# Patient Record
Sex: Female | Born: 1985 | Race: White | Hispanic: No | Marital: Married | Smoking: Never smoker
Health system: Southern US, Community
[De-identification: ages and names within clinical notes are randomized; demographics above are authoritative.]

## PROBLEM LIST (undated history)

## (undated) DIAGNOSIS — F32A Depression, unspecified: Secondary | ICD-10-CM

## (undated) DIAGNOSIS — N809 Endometriosis, unspecified: Secondary | ICD-10-CM

## (undated) DIAGNOSIS — D6861 Antiphospholipid syndrome: Secondary | ICD-10-CM

## (undated) DIAGNOSIS — O26619 Liver and biliary tract disorders in pregnancy, unspecified trimester: Secondary | ICD-10-CM

## (undated) DIAGNOSIS — J45909 Unspecified asthma, uncomplicated: Secondary | ICD-10-CM

## (undated) DIAGNOSIS — K831 Obstruction of bile duct: Secondary | ICD-10-CM

## (undated) DIAGNOSIS — R87629 Unspecified abnormal cytological findings in specimens from vagina: Secondary | ICD-10-CM

## (undated) DIAGNOSIS — A630 Anogenital (venereal) warts: Secondary | ICD-10-CM

## (undated) DIAGNOSIS — K219 Gastro-esophageal reflux disease without esophagitis: Secondary | ICD-10-CM

## (undated) DIAGNOSIS — F419 Anxiety disorder, unspecified: Secondary | ICD-10-CM

## (undated) HISTORY — PX: WISDOM TOOTH EXTRACTION: SHX21

## (undated) HISTORY — PX: COLPOSCOPY: SHX161

---

## 2016-01-24 LAB — OB RESULTS CONSOLE GC/CHLAMYDIA
CHLAMYDIA, DNA PROBE: NEGATIVE
Gonorrhea: NEGATIVE

## 2016-02-03 LAB — OB RESULTS CONSOLE HEPATITIS B SURFACE ANTIGEN: HEP B S AG: NEGATIVE

## 2016-02-03 LAB — OB RESULTS CONSOLE ANTIBODY SCREEN: Antibody Screen: NEGATIVE

## 2016-02-03 LAB — OB RESULTS CONSOLE RPR: RPR: NONREACTIVE

## 2016-02-03 LAB — OB RESULTS CONSOLE HIV ANTIBODY (ROUTINE TESTING): HIV: NONREACTIVE

## 2016-02-03 LAB — OB RESULTS CONSOLE RUBELLA ANTIBODY, IGM: Rubella: IMMUNE

## 2016-02-03 LAB — OB RESULTS CONSOLE ABO/RH: RH TYPE: POSITIVE

## 2016-03-06 ENCOUNTER — Inpatient Hospital Stay (HOSPITAL_COMMUNITY): Admission: AD | Admit: 2016-03-06 | Payer: Self-pay | Source: Ambulatory Visit | Admitting: Obstetrics and Gynecology

## 2016-07-25 LAB — OB RESULTS CONSOLE GBS: STREP GROUP B AG: NEGATIVE

## 2016-08-26 ENCOUNTER — Inpatient Hospital Stay (HOSPITAL_COMMUNITY): Payer: Managed Care, Other (non HMO) | Admitting: Anesthesiology

## 2016-08-26 ENCOUNTER — Inpatient Hospital Stay (HOSPITAL_COMMUNITY)
Admission: AD | Admit: 2016-08-26 | Discharge: 2016-08-29 | DRG: 766 | Disposition: A | Discharge status: Home / Self Care | Payer: Managed Care, Other (non HMO) | Source: Ambulatory Visit | Attending: Obstetrics | Admitting: Obstetrics

## 2016-08-26 ENCOUNTER — Encounter (HOSPITAL_COMMUNITY): Payer: Self-pay | Admitting: *Deleted

## 2016-08-26 DIAGNOSIS — O9902 Anemia complicating childbirth: Secondary | ICD-10-CM | POA: Diagnosis present

## 2016-08-26 DIAGNOSIS — O9962 Diseases of the digestive system complicating childbirth: Secondary | ICD-10-CM | POA: Diagnosis present

## 2016-08-26 DIAGNOSIS — O324XX0 Maternal care for high head at term, not applicable or unspecified: Secondary | ICD-10-CM | POA: Diagnosis present

## 2016-08-26 DIAGNOSIS — Z98891 History of uterine scar from previous surgery: Secondary | ICD-10-CM

## 2016-08-26 DIAGNOSIS — O1405 Mild to moderate pre-eclampsia, complicating the puerperium: Secondary | ICD-10-CM | POA: Diagnosis not present

## 2016-08-26 DIAGNOSIS — Z3A40 40 weeks gestation of pregnancy: Secondary | ICD-10-CM

## 2016-08-26 DIAGNOSIS — Z3493 Encounter for supervision of normal pregnancy, unspecified, third trimester: Secondary | ICD-10-CM | POA: Diagnosis present

## 2016-08-26 DIAGNOSIS — O3663X0 Maternal care for excessive fetal growth, third trimester, not applicable or unspecified: Principal | ICD-10-CM | POA: Diagnosis present

## 2016-08-26 DIAGNOSIS — D649 Anemia, unspecified: Secondary | ICD-10-CM | POA: Diagnosis present

## 2016-08-26 DIAGNOSIS — K219 Gastro-esophageal reflux disease without esophagitis: Secondary | ICD-10-CM | POA: Diagnosis present

## 2016-08-26 HISTORY — DX: Endometriosis, unspecified: N80.9

## 2016-08-26 HISTORY — DX: Unspecified abnormal cytological findings in specimens from vagina: R87.629

## 2016-08-26 HISTORY — DX: Gastro-esophageal reflux disease without esophagitis: K21.9

## 2016-08-26 HISTORY — DX: Anogenital (venereal) warts: A63.0

## 2016-08-26 LAB — TYPE AND SCREEN
ABO/RH(D): A POS
Antibody Screen: NEGATIVE

## 2016-08-26 LAB — CBC
HEMATOCRIT: 39.3 % (ref 36.0–46.0)
HEMOGLOBIN: 13.5 g/dL (ref 12.0–15.0)
MCH: 33.1 pg (ref 26.0–34.0)
MCHC: 34.4 g/dL (ref 30.0–36.0)
MCV: 96.3 fL (ref 78.0–100.0)
Platelets: 168 10*3/uL (ref 150–400)
RBC: 4.08 MIL/uL (ref 3.87–5.11)
RDW: 13.1 % (ref 11.5–15.5)
WBC: 7.8 10*3/uL (ref 4.0–10.5)

## 2016-08-26 LAB — ABO/RH: ABO/RH(D): A POS

## 2016-08-26 MED ORDER — LACTATED RINGERS IV SOLN
500.0000 mL | Freq: Once | INTRAVENOUS | Status: AC
  Administered 2016-08-26: 500 mL via INTRAVENOUS

## 2016-08-26 MED ORDER — DIPHENHYDRAMINE HCL 50 MG/ML IJ SOLN: 12.5000 mg | INTRAMUSCULAR | Status: DC | PRN

## 2016-08-26 MED ORDER — LIDOCAINE HCL (PF) 1 % IJ SOLN
30.0000 mL | INTRAMUSCULAR | Status: DC | PRN
  Filled 2016-08-26: qty 30

## 2016-08-26 MED ORDER — LACTATED RINGERS IV SOLN
INTRAVENOUS | Status: DC
  Administered 2016-08-26 (×2): 125 mL/h via INTRAVENOUS
  Administered 2016-08-27: 01:00:00 via INTRAVENOUS

## 2016-08-26 MED ORDER — LACTATED RINGERS IV SOLN: 500.0000 mL | INTRAVENOUS | Status: DC | PRN

## 2016-08-26 MED ORDER — ACETAMINOPHEN 325 MG PO TABS: 650.0000 mg | ORAL_TABLET | ORAL | Status: DC | PRN

## 2016-08-26 MED ORDER — FENTANYL 2.5 MCG/ML BUPIVACAINE 1/10 % EPIDURAL INFUSION (WH - ANES)
14.0000 mL/h | INTRAMUSCULAR | Status: DC | PRN
  Administered 2016-08-26: 14 mL/h via EPIDURAL
  Filled 2016-08-26 (×2): qty 100

## 2016-08-26 MED ORDER — OXYTOCIN 40 UNITS IN LACTATED RINGERS INFUSION - SIMPLE MED
2.5000 [IU]/h | INTRAVENOUS | Status: DC
  Filled 2016-08-26: qty 1000

## 2016-08-26 MED ORDER — TERBUTALINE SULFATE 1 MG/ML IJ SOLN: 0.2500 mg | Freq: Once | INTRAMUSCULAR | Status: DC | PRN

## 2016-08-26 MED ORDER — PHENYLEPHRINE 40 MCG/ML (10ML) SYRINGE FOR IV PUSH (FOR BLOOD PRESSURE SUPPORT)
80.0000 ug | PREFILLED_SYRINGE | INTRAVENOUS | Status: DC | PRN
  Filled 2016-08-26: qty 10

## 2016-08-26 MED ORDER — OXYCODONE-ACETAMINOPHEN 5-325 MG PO TABS: 1.0000 | ORAL_TABLET | ORAL | Status: DC | PRN

## 2016-08-26 MED ORDER — OXYTOCIN BOLUS FROM INFUSION: 500.0000 mL | Freq: Once | INTRAVENOUS | Status: DC

## 2016-08-26 MED ORDER — ONDANSETRON HCL 4 MG/2ML IJ SOLN
4.0000 mg | Freq: Four times a day (QID) | INTRAMUSCULAR | Status: DC | PRN
  Administered 2016-08-26: 4 mg via INTRAVENOUS
  Filled 2016-08-26: qty 2

## 2016-08-26 MED ORDER — SOD CITRATE-CITRIC ACID 500-334 MG/5ML PO SOLN
30.0000 mL | ORAL | Status: DC | PRN
  Administered 2016-08-26 – 2016-08-27 (×2): 30 mL via ORAL
  Filled 2016-08-26 (×2): qty 15

## 2016-08-26 MED ORDER — EPHEDRINE 5 MG/ML INJ: 10.0000 mg | INTRAVENOUS | Status: DC | PRN

## 2016-08-26 MED ORDER — OXYTOCIN 40 UNITS IN LACTATED RINGERS INFUSION - SIMPLE MED
1.0000 m[IU]/min | INTRAVENOUS | Status: DC
  Administered 2016-08-26: 2 m[IU]/min via INTRAVENOUS

## 2016-08-26 MED ORDER — PHENYLEPHRINE 40 MCG/ML (10ML) SYRINGE FOR IV PUSH (FOR BLOOD PRESSURE SUPPORT): 80.0000 ug | PREFILLED_SYRINGE | INTRAVENOUS | Status: DC | PRN

## 2016-08-26 MED ORDER — OXYCODONE-ACETAMINOPHEN 5-325 MG PO TABS: 2.0000 | ORAL_TABLET | ORAL | Status: DC | PRN

## 2016-08-26 MED ORDER — LIDOCAINE HCL (PF) 1 % IJ SOLN
INTRAMUSCULAR | Status: DC | PRN
  Administered 2016-08-26 (×2): 4 mL via EPIDURAL

## 2016-08-26 MED ORDER — FLEET ENEMA 7-19 GM/118ML RE ENEM: 1.0000 | ENEMA | Freq: Once | RECTAL | Status: DC

## 2016-08-26 NOTE — Anesthesia Pain Management Evaluation Note (Signed)
  CRNA Pain Management Visit Note  Patient: Kaitlin Ward, 31 y.o., female  "Hello I am a member of the anesthesia team at [facility]. We have an anesthesia team available at all times to provide care throughout the hospital, including epidural management and anesthesia for C-section. I don't know your plan for the delivery whether it a natural birth, water birth, IV sedation, nitrous supplementation, doula or epidural, but we want to meet your pain goals."   1.Was your pain managed to your expectations on prior hospitalizations?   No prior hospitalizations  2.What is your expectation for pain management during this hospitalization?     Epidural  3.How can we help you reach that goal? Maintain the epidural until baby is delivered.  Record the patient's initial score and the patient's pain goal.   Pain: 10 prior to epidural, it is now rated at 1.  Pain Goal: 2 [facility] wants you to be able to say your pain was always managed very well.  Seth Friedlander 08/26/2016

## 2016-08-26 NOTE — Progress Notes (Signed)
S: Doing well, no complaints, pain well controlled with epidural  O: BP (!) 144/85   Pulse 92   Temp 98.5 F (36.9 C) (Oral)   Resp 18   Ht 5\' 5"  (1.651 m)   Wt 69.9 kg (154 lb)   BMI 25.63 kg/m    FHT:  FHR: 130s bpm, variability: minimal ,  accelerations:  Present,  decelerations:  Absent UC:   regular, every 2-4 minutes SVE:   Dilation: 8 Effacement (%): 80 Station: -2 Exam by:: Dr. Ernestina PennaFogleman   A / P:  31 y.o.  Obstetric History   G1   P0   T0   P0   A0   L0    SAB0   TAB0   Ectopic0   Multiple0   Live Births0    at 3460w5d Active labor,no change over several hours, IUPC placed  Fetal Wellbeing:  Category I Pain Control:  Epidural  Anticipated MOD:  unsure  Cathy Ropp A. 08/26/2016, 9:06 PM

## 2016-08-26 NOTE — H&P (Signed)
Kaitlin LauthKathleen Ward is a 31 y.o. G1P0 at 73102w5d presenting for active . Pt notes onset contractions at 11 AM but really got more painful and frequent at 1:30 PM. . Good fetal movement, No vaginal bleeding, started leaking fluid at 11 AM.  [facility] - Dated by first trimester ultrasound - Elevated diabetic screen at 138% normal 3 hour gtt. - GERD on Zantac - LSIL Pap, high risk HPV positive, 16/18 negative - LGA baby, growth at 34 weeks at 90th percentile with AC at 98th percentile; growth at 40'2: 8'15, 87th percentile   Prenatal Transfer Tool  Maternal Diabetes: No Genetic Screening: Normal Maternal Ultrasounds/Referrals: Normal Fetal Ultrasounds or other Referrals:  None Maternal Substance Abuse:  No Significant Maternal Medications:  None Significant Maternal Lab Results: None     OB History    Gravida Para Term Preterm AB Living   1             SAB TAB Ectopic Multiple Live Births                 Past Medical History:  Diagnosis Date  . Endometriosis   . GERD (gastroesophageal reflux disease)    Past Surgical History:  Procedure Laterality Date  . COLPOSCOPY     Family History: family history is not on file. Social History:  has no tobacco, alcohol, and drug history on file.  Review of Systems - Negative except Leaking fluid, painful contractions   Dilation: 6 Effacement (%): 100 Station: -1 Exam by:: Weston,RN Blood pressure (!) 152/91, pulse 86, temperature 98.7 F (37.1 C), temperature source Oral, resp. rate 20, height 5\' 5"  (1.651 m), weight 69.9 kg (154 lb).  Physical Exam:  Gen: well appearing, no distress, breathing uncomfortably with contractions  Back: no CVAT Abd: gravid, NT, no RUQ pain, LGA LE: No edema, equal bilaterally, non-tender Toco: Every 3 minutes FH: baseline 125s, accelerations present, no deceleratons, 10 beat variability  Prenatal labs: ABO, Rh: A/Positive/-- (12/30 0000) Antibody: Negative (12/30  0000) Rubella: !Error! Immune RPR: Nonreactive (12/30 0000)  HBsAg: Negative (12/30 0000)  HIV: Non-reactive (12/30 0000)  GBS: Negative (06/21 0000)  1 hr Glucola 138  Genetic screening normal NT, normal AFP Anatomy US normal anatomy   Assessment/Plan: 31 y.o. G1P0 at 33102w5d Active labor. Plan expectant management. Patient hoping for unmedicated delivery and has doula with her.  GBS negative LGA: Watch labor curve   Gerlean Cid A. 08/26/2016, 3:57 PM

## 2016-08-26 NOTE — Progress Notes (Signed)
S: Doing well, no complaints, pain well controlled with epidural.  O: BP 136/87   Pulse 94   Temp 99.5 F (37.5 C) (Oral)   Resp 18   Ht 5\' 5"  (1.651 m)   Wt 69.9 kg (154 lb)   BMI 25.63 kg/m    FHT:  FHR: 135s bpm, variability: minimal ,  accelerations:  Present,  decelerations:  Absent UC:   regular, every 2-4 minutes, low MVU SVE:   Dilation: 9 Effacement (%): 80 Station: 0 Exam by:: Minus Libertyhristy Leshowitz, RN   A / P:  31 y.o.  Obstetric History   G1   P0   T0   P0   A0   L0    SAB0   TAB0   Ectopic0   Multiple0   Live Births0    at 322w5d slow progress, concern for developing chorio given decreased FH variability, low MVU, and pt's temp. Encouraged pt to consider pitocin for augmentation  Fetal Wellbeing:  Category I Pain Control:  Epidural  Anticipated MOD:  Unsure  Kaitlin Ward A. 08/26/2016, 10:13 PM

## 2016-08-26 NOTE — Anesthesia Preprocedure Evaluation (Addendum)
Anesthesia Evaluation  Patient identified by MRN, date of birth, ID band Patient awake    Reviewed: Allergy & Precautions, Patient's Chart, lab work & pertinent test results  Airway Mallampati: II  TM Distance: >3 FB Neck ROM: Full    Dental no notable dental hx. (+) Teeth Intact   Pulmonary neg pulmonary ROS,    Pulmonary exam normal breath sounds clear to auscultation       Cardiovascular negative cardio ROS Normal cardiovascular exam Rhythm:Regular Rate:Normal     Neuro/Psych negative neurological ROS  negative psych ROS   GI/Hepatic Neg liver ROS, GERD  Medicated and Controlled,  Endo/Other  negative endocrine ROS  Renal/GU negative Renal ROS  negative genitourinary   Musculoskeletal negative musculoskeletal ROS (+)   Abdominal   Peds  Hematology negative hematology ROS (+)   Anesthesia Other Findings   Reproductive/Obstetrics (+) Pregnancy HPV                             Anesthesia Physical Anesthesia Plan  ASA: II and emergent  Anesthesia Plan: Epidural   Post-op Pain Management:    Induction:   PONV Risk Score and Plan: 2 and Ondansetron, Dexamethasone, Scopolamine patch - Pre-op, Metaclopromide and Treatment may vary due to age or medical condition  Airway Management Planned: Natural Airway  Additional Equipment:   Intra-op Plan:   Post-operative Plan:   Informed Consent: I have reviewed the patients History and Physical, chart, labs and discussed the procedure including the risks, benefits and alternatives for the proposed anesthesia with the patient or authorized representative who has indicated his/her understanding and acceptance.     Plan Discussed with: Anesthesiologist, Surgeon and CRNA  Anesthesia Plan Comments: (Patient for C/section for arrest of descent. Will use epidural for C/section.)       Anesthesia Quick Evaluation

## 2016-08-26 NOTE — Anesthesia Procedure Notes (Signed)
Epidural Patient location during procedure: OB Start time: 08/26/2016 5:31 PM  Staffing Anesthesiologist: Mal AmabileFOSTER, Luciel Brickman Performed: anesthesiologist   Preanesthetic Checklist Completed: patient identified, site marked, surgical consent, pre-op evaluation, timeout performed, IV checked, risks and benefits discussed and monitors and equipment checked  Epidural Patient position: sitting Prep: site prepped and draped and DuraPrep Patient monitoring: continuous pulse ox and blood pressure Approach: midline Location: L3-L4 Injection technique: LOR air  Needle:  Needle type: Tuohy  Needle gauge: 17 G Needle length: 9 cm and 9 Needle insertion depth: 4 cm Catheter type: closed end flexible Catheter size: 19 Gauge Catheter at skin depth: 9 cm Test dose: negative and Other  Assessment Events: blood not aspirated, injection not painful, no injection resistance, negative IV test and no paresthesia  Additional Notes Patient identified. Risks and benefits discussed including failed block, incomplete  Pain control, post dural puncture headache, nerve damage, paralysis, blood pressure Changes, nausea, vomiting, reactions to medications-both toxic and allergic and post Partum back pain. All questions were answered. Patient expressed understanding and wished to proceed. Sterile technique was used throughout procedure. Epidural site was Dressed with sterile barrier dressing. No paresthesias, signs of intravascular injection Or signs of intrathecal spread were encountered.  Patient was more comfortable after the epidural was dosed. Please see RN's note for documentation of vital signs and FHR which are stable.

## 2016-08-26 NOTE — Progress Notes (Signed)
Pt initialling leaning over bed then on hand and knees in bed

## 2016-08-26 NOTE — Progress Notes (Signed)
S: Doing well, no complaints, pain well controlled with epidural  O: BP 139/81   Pulse 73   Temp 98.1 F (36.7 C) (Oral)   Resp 20   Ht 5\' 5"  (1.651 m)   Wt 69.9 kg (154 lb)   BMI 25.63 kg/m    FHT:  FHR: 140s bpm, variability: moderate,  accelerations:  Present,  decelerations:  Absent UC:   regular, every 4 minutes SVE:   Dilation: 8 Effacement (%): 80 Station: -2 Exam by:: Dr Ernestina PennaFogleman  AROM forebag, meconium  A / P:  31 y.o.  Obstetric History   G1   P0   T0   P0   A0   L0    SAB0   TAB0   Ectopic0   Multiple0   Live Births0    at 2643w5d Spontaneous labor, progressing normally  Fetal Wellbeing:  Category I Pain Control:  Epidural  Anticipated MOD:  NSVD  Lyndon Chapel A. 08/26/2016, 6:22 PM

## 2016-08-26 NOTE — MAU Note (Signed)
Pt presents with contractions and ROM at 1130

## 2016-08-27 ENCOUNTER — Encounter (HOSPITAL_COMMUNITY)
Admission: AD | Disposition: A | Discharge status: Home / Self Care | Payer: Self-pay | Source: Ambulatory Visit | Attending: Obstetrics

## 2016-08-27 ENCOUNTER — Encounter (HOSPITAL_COMMUNITY): Payer: Self-pay | Admitting: Anesthesiology

## 2016-08-27 DIAGNOSIS — Z98891 History of uterine scar from previous surgery: Secondary | ICD-10-CM

## 2016-08-27 LAB — BASIC METABOLIC PANEL
ANION GAP: 8 (ref 5–15)
BUN: 16 mg/dL (ref 6–20)
CALCIUM: 8.8 mg/dL — AB (ref 8.9–10.3)
CO2: 22 mmol/L (ref 22–32)
Chloride: 106 mmol/L (ref 101–111)
Creatinine, Ser: 1.56 mg/dL — ABNORMAL HIGH (ref 0.44–1.00)
GFR calc Af Amer: 51 mL/min — ABNORMAL LOW (ref >60)
GFR, EST NON AFRICAN AMERICAN: 44 mL/min — AB (ref >60)
GLUCOSE: 111 mg/dL — AB (ref 65–99)
Potassium: 4.2 mmol/L (ref 3.5–5.1)
SODIUM: 136 mmol/L (ref 135–145)

## 2016-08-27 LAB — CBC
HCT: 33.4 % — ABNORMAL LOW (ref 36.0–46.0)
HEMOGLOBIN: 11.3 g/dL — AB (ref 12.0–15.0)
MCH: 32.7 pg (ref 26.0–34.0)
MCHC: 33.8 g/dL (ref 30.0–36.0)
MCV: 96.5 fL (ref 78.0–100.0)
Platelets: 127 10*3/uL — ABNORMAL LOW (ref 150–400)
RBC: 3.46 MIL/uL — ABNORMAL LOW (ref 3.87–5.11)
RDW: 13.5 % (ref 11.5–15.5)
WBC: 14.1 10*3/uL — ABNORMAL HIGH (ref 4.0–10.5)

## 2016-08-27 LAB — HEPATIC FUNCTION PANEL
ALK PHOS: 149 U/L — AB (ref 38–126)
ALT: 12 U/L — ABNORMAL LOW (ref 14–54)
AST: 34 U/L (ref 15–41)
Albumin: 2.5 g/dL — ABNORMAL LOW (ref 3.5–5.0)
BILIRUBIN INDIRECT: 0.3 mg/dL (ref 0.3–0.9)
BILIRUBIN TOTAL: 0.4 mg/dL (ref 0.3–1.2)
Bilirubin, Direct: 0.1 mg/dL (ref 0.1–0.5)
Total Protein: 4.9 g/dL — ABNORMAL LOW (ref 6.5–8.1)

## 2016-08-27 LAB — PROTEIN / CREATININE RATIO, URINE
CREATININE, URINE: 100 mg/dL
Protein Creatinine Ratio: 1 mg/mg{Cre} — ABNORMAL HIGH (ref 0.00–0.15)
Total Protein, Urine: 100 mg/dL

## 2016-08-27 LAB — RPR: RPR Ser Ql: NONREACTIVE

## 2016-08-27 LAB — URIC ACID: URIC ACID, SERUM: 9 mg/dL — AB (ref 2.3–6.6)

## 2016-08-27 SURGERY — Surgical Case
Anesthesia: Epidural

## 2016-08-27 MED ORDER — LACTATED RINGERS IV SOLN
INTRAVENOUS | Status: DC
  Administered 2016-08-27: 10:00:00 via INTRAVENOUS

## 2016-08-27 MED ORDER — MEPERIDINE HCL 25 MG/ML IJ SOLN
INTRAMUSCULAR | Status: DC | PRN
  Administered 2016-08-27 (×2): 12.5 mg via INTRAVENOUS

## 2016-08-27 MED ORDER — SODIUM BICARBONATE 8.4 % IV SOLN
INTRAVENOUS | Status: AC
  Filled 2016-08-27: qty 50

## 2016-08-27 MED ORDER — WITCH HAZEL-GLYCERIN EX PADS: 1.0000 "application " | MEDICATED_PAD | CUTANEOUS | Status: DC | PRN

## 2016-08-27 MED ORDER — LABETALOL HCL 5 MG/ML IV SOLN
20.0000 mg | INTRAVENOUS | Status: DC | PRN
  Administered 2016-08-27: 20 mg via INTRAVENOUS

## 2016-08-27 MED ORDER — CEFAZOLIN SODIUM-DEXTROSE 2-3 GM-% IV SOLR
INTRAVENOUS | Status: DC | PRN
  Administered 2016-08-27: 2 g via INTRAVENOUS

## 2016-08-27 MED ORDER — SIMETHICONE 80 MG PO CHEW
80.0000 mg | CHEWABLE_TABLET | ORAL | Status: DC
  Administered 2016-08-28 (×2): 80 mg via ORAL
  Filled 2016-08-27 (×2): qty 1

## 2016-08-27 MED ORDER — ZOLPIDEM TARTRATE 5 MG PO TABS: 5.0000 mg | ORAL_TABLET | Freq: Every evening | ORAL | Status: DC | PRN

## 2016-08-27 MED ORDER — FENTANYL CITRATE (PF) 100 MCG/2ML IJ SOLN
INTRAMUSCULAR | Status: AC
Start: 2016-08-27 — End: 2016-08-27
  Filled 2016-08-27: qty 2

## 2016-08-27 MED ORDER — ACETAMINOPHEN 325 MG PO TABS
650.0000 mg | ORAL_TABLET | ORAL | Status: DC | PRN
  Administered 2016-08-27: 650 mg via ORAL
  Filled 2016-08-27: qty 2

## 2016-08-27 MED ORDER — SENNOSIDES-DOCUSATE SODIUM 8.6-50 MG PO TABS
2.0000 | ORAL_TABLET | ORAL | Status: DC
  Administered 2016-08-28 (×2): 2 via ORAL
  Filled 2016-08-27 (×2): qty 2

## 2016-08-27 MED ORDER — DEXAMETHASONE SODIUM PHOSPHATE 10 MG/ML IJ SOLN
INTRAMUSCULAR | Status: DC | PRN
  Administered 2016-08-27: 10 mg via INTRAVENOUS

## 2016-08-27 MED ORDER — MENTHOL 3 MG MT LOZG
1.0000 | LOZENGE | OROMUCOSAL | Status: DC | PRN
  Administered 2016-08-27: 3 mg via ORAL
  Filled 2016-08-27: qty 9

## 2016-08-27 MED ORDER — SCOPOLAMINE 1 MG/3DAYS TD PT72
MEDICATED_PATCH | TRANSDERMAL | Status: DC | PRN
  Administered 2016-08-27: 1 via TRANSDERMAL

## 2016-08-27 MED ORDER — SODIUM CHLORIDE 0.9 % IR SOLN
Status: DC | PRN
  Administered 2016-08-27: 1

## 2016-08-27 MED ORDER — ONDANSETRON HCL 4 MG/2ML IJ SOLN
INTRAMUSCULAR | Status: DC | PRN
  Administered 2016-08-27: 4 mg via INTRAVENOUS

## 2016-08-27 MED ORDER — LACTATED RINGERS IV SOLN
INTRAVENOUS | Status: DC | PRN
  Administered 2016-08-27: 05:00:00 via INTRAVENOUS

## 2016-08-27 MED ORDER — FENTANYL CITRATE (PF) 100 MCG/2ML IJ SOLN
INTRAMUSCULAR | Status: DC | PRN
  Administered 2016-08-27: 100 ug via EPIDURAL

## 2016-08-27 MED ORDER — COCONUT OIL OIL: 1.0000 "application " | TOPICAL_OIL | Status: DC | PRN

## 2016-08-27 MED ORDER — MORPHINE SULFATE (PF) 0.5 MG/ML IJ SOLN
INTRAMUSCULAR | Status: AC
  Filled 2016-08-27: qty 10

## 2016-08-27 MED ORDER — METOCLOPRAMIDE HCL 5 MG/ML IJ SOLN: 10.0000 mg | Freq: Once | INTRAMUSCULAR | Status: DC | PRN

## 2016-08-27 MED ORDER — ONDANSETRON HCL 4 MG/2ML IJ SOLN
INTRAMUSCULAR | Status: AC
  Filled 2016-08-27: qty 2

## 2016-08-27 MED ORDER — DEXAMETHASONE SODIUM PHOSPHATE 10 MG/ML IJ SOLN
INTRAMUSCULAR | Status: AC
  Filled 2016-08-27: qty 1

## 2016-08-27 MED ORDER — OXYTOCIN 10 UNIT/ML IJ SOLN
INTRAMUSCULAR | Status: AC
  Filled 2016-08-27: qty 4

## 2016-08-27 MED ORDER — FENTANYL CITRATE (PF) 100 MCG/2ML IJ SOLN
INTRAMUSCULAR | Status: AC
  Filled 2016-08-27: qty 2

## 2016-08-27 MED ORDER — LACTATED RINGERS IV SOLN
INTRAVENOUS | Status: DC | PRN
  Administered 2016-08-27 (×2): via INTRAVENOUS

## 2016-08-27 MED ORDER — LABETALOL HCL 5 MG/ML IV SOLN
INTRAVENOUS | Status: AC
  Filled 2016-08-27: qty 4

## 2016-08-27 MED ORDER — LIDOCAINE-EPINEPHRINE (PF) 2 %-1:200000 IJ SOLN
INTRAMUSCULAR | Status: AC
  Filled 2016-08-27: qty 20

## 2016-08-27 MED ORDER — IBUPROFEN 600 MG PO TABS
600.0000 mg | ORAL_TABLET | Freq: Four times a day (QID) | ORAL | Status: DC
  Administered 2016-08-27 – 2016-08-29 (×9): 600 mg via ORAL
  Filled 2016-08-27 (×10): qty 1

## 2016-08-27 MED ORDER — SIMETHICONE 80 MG PO CHEW
80.0000 mg | CHEWABLE_TABLET | Freq: Three times a day (TID) | ORAL | Status: DC
  Administered 2016-08-27 – 2016-08-28 (×4): 80 mg via ORAL
  Filled 2016-08-27 (×6): qty 1

## 2016-08-27 MED ORDER — DIPHENHYDRAMINE HCL 25 MG PO CAPS: 25.0000 mg | ORAL_CAPSULE | Freq: Four times a day (QID) | ORAL | Status: DC | PRN

## 2016-08-27 MED ORDER — OXYTOCIN 40 UNITS IN LACTATED RINGERS INFUSION - SIMPLE MED: 2.5000 [IU]/h | INTRAVENOUS | Status: AC

## 2016-08-27 MED ORDER — TETANUS-DIPHTH-ACELL PERTUSSIS 5-2.5-18.5 LF-MCG/0.5 IM SUSP: 0.5000 mL | Freq: Once | INTRAMUSCULAR | Status: DC

## 2016-08-27 MED ORDER — SIMETHICONE 80 MG PO CHEW: 80.0000 mg | CHEWABLE_TABLET | ORAL | Status: DC | PRN

## 2016-08-27 MED ORDER — SODIUM BICARBONATE 8.4 % IV SOLN
INTRAVENOUS | Status: DC | PRN
  Administered 2016-08-27: 3 mL via EPIDURAL
  Administered 2016-08-27: 4 mL via EPIDURAL
  Administered 2016-08-27 (×2): 5 mL via EPIDURAL

## 2016-08-27 MED ORDER — MEPERIDINE HCL 25 MG/ML IJ SOLN
INTRAMUSCULAR | Status: AC
  Filled 2016-08-27: qty 1

## 2016-08-27 MED ORDER — SCOPOLAMINE 1 MG/3DAYS TD PT72
MEDICATED_PATCH | TRANSDERMAL | Status: AC
  Filled 2016-08-27: qty 1

## 2016-08-27 MED ORDER — DIBUCAINE 1 % RE OINT: 1.0000 "application " | TOPICAL_OINTMENT | RECTAL | Status: DC | PRN

## 2016-08-27 MED ORDER — PRENATAL MULTIVITAMIN CH
1.0000 | ORAL_TABLET | Freq: Every day | ORAL | Status: DC
  Administered 2016-08-27 – 2016-08-29 (×3): 1 via ORAL
  Filled 2016-08-27 (×3): qty 1

## 2016-08-27 MED ORDER — OXYTOCIN 10 UNIT/ML IJ SOLN
INTRAVENOUS | Status: DC | PRN
  Administered 2016-08-27: 40 [IU] via INTRAVENOUS

## 2016-08-27 MED ORDER — HYDRALAZINE HCL 20 MG/ML IJ SOLN: 10.0000 mg | Freq: Once | INTRAMUSCULAR | Status: DC | PRN

## 2016-08-27 MED ORDER — FENTANYL CITRATE (PF) 100 MCG/2ML IJ SOLN
25.0000 ug | INTRAMUSCULAR | Status: DC | PRN
  Administered 2016-08-27: 50 ug via INTRAVENOUS

## 2016-08-27 SURGICAL SUPPLY — 38 items
BENZOIN TINCTURE PRP APPL 2/3 (GAUZE/BANDAGES/DRESSINGS) ×3 IMPLANT
CHLORAPREP W/TINT 26ML (MISCELLANEOUS) ×3 IMPLANT
CLAMP CORD UMBIL (MISCELLANEOUS) IMPLANT
CLOSURE STERI STRIP 1/2 X4 (GAUZE/BANDAGES/DRESSINGS) ×3 IMPLANT
CLOSURE WOUND 1/2 X4 (GAUZE/BANDAGES/DRESSINGS)
CLOTH BEACON ORANGE TIMEOUT ST (SAFETY) ×3 IMPLANT
CONTAINER PREFILL 10% NBF 15ML (MISCELLANEOUS) IMPLANT
DERMABOND ADVANCED (GAUZE/BANDAGES/DRESSINGS) ×2
DERMABOND ADVANCED .7 DNX12 (GAUZE/BANDAGES/DRESSINGS) ×1 IMPLANT
DRSG OPSITE POSTOP 4X10 (GAUZE/BANDAGES/DRESSINGS) ×3 IMPLANT
ELECT REM PT RETURN 9FT ADLT (ELECTROSURGICAL) ×3
ELECTRODE REM PT RTRN 9FT ADLT (ELECTROSURGICAL) ×1 IMPLANT
EXTRACTOR VACUUM M CUP 4 TUBE (SUCTIONS) IMPLANT
EXTRACTOR VACUUM M CUP 4' TUBE (SUCTIONS)
GLOVE BIO SURGEON STRL SZ 6.5 (GLOVE) ×2 IMPLANT
GLOVE BIO SURGEONS STRL SZ 6.5 (GLOVE) ×1
GLOVE BIOGEL PI IND STRL 7.0 (GLOVE) ×2 IMPLANT
GLOVE BIOGEL PI INDICATOR 7.0 (GLOVE) ×4
GOWN STRL REUS W/TWL LRG LVL3 (GOWN DISPOSABLE) ×6 IMPLANT
KIT ABG SYR 3ML LUER SLIP (SYRINGE) IMPLANT
NEEDLE HYPO 22GX1.5 SAFETY (NEEDLE) IMPLANT
NEEDLE HYPO 25X5/8 SAFETYGLIDE (NEEDLE) IMPLANT
NS IRRIG 1000ML POUR BTL (IV SOLUTION) ×3 IMPLANT
PACK C SECTION WH (CUSTOM PROCEDURE TRAY) ×3 IMPLANT
PAD OB MATERNITY 4.3X12.25 (PERSONAL CARE ITEMS) ×3 IMPLANT
PENCIL SMOKE EVAC W/HOLSTER (ELECTROSURGICAL) ×3 IMPLANT
STRIP CLOSURE SKIN 1/2X4 (GAUZE/BANDAGES/DRESSINGS) IMPLANT
SUT MON AB 4-0 PS1 27 (SUTURE) ×3 IMPLANT
SUT PLAIN 0 NONE (SUTURE) IMPLANT
SUT PLAIN 2 0 XLH (SUTURE) IMPLANT
SUT VIC AB 0 CT1 36 (SUTURE) ×6 IMPLANT
SUT VIC AB 0 CTX 36 (SUTURE) ×4
SUT VIC AB 0 CTX36XBRD ANBCTRL (SUTURE) ×2 IMPLANT
SUT VIC AB 2-0 CT1 27 (SUTURE) ×2
SUT VIC AB 2-0 CT1 TAPERPNT 27 (SUTURE) ×1 IMPLANT
SYR CONTROL 10ML LL (SYRINGE) IMPLANT
TOWEL OR 17X24 6PK STRL BLUE (TOWEL DISPOSABLE) ×3 IMPLANT
TRAY FOLEY BAG SILVER LF 14FR (SET/KITS/TRAYS/PACK) IMPLANT

## 2016-08-27 NOTE — Progress Notes (Signed)
Visit to eval for PEC  Pt notes feeling tired and sore. No current emesis. No HA, no vision change.  PE:  Vitals:   08/27/16 0825 08/27/16 0925 08/27/16 1010 08/27/16 1305  BP: (!) 165/95 (!) 164/92 132/77 129/76  Pulse: 82 76 80 85  Resp: 18 18 20 18   Temp: 98.9 F (37.2 C)  98.2 F (36.8 C) 98.1 F (36.7 C)  TempSrc:    Oral  SpO2: 96% 96% 95% 97%  Weight:      Height:       Gen: no distress Abd: soft, appropriately tender, no RUQ pain LE: no edema, 3+ DTR, 1 beat clonus b/l  CBC Latest Ref Rng & Units 08/27/2016 08/26/2016  WBC 4.0 - 10.5 K/uL 14.1(H) 7.8  Hemoglobin 12.0 - 15.0 g/dL 11.3(L) 13.5  Hematocrit 36.0 - 46.0 % 33.4(L) 39.3  Platelets 150 - 400 K/uL 127(L) 168     BMET    Component Value Date/Time   NA 136 08/27/2016 0922   K 4.2 08/27/2016 0922   CL 106 08/27/2016 0922   CO2 22 08/27/2016 0922   GLUCOSE 111 (H) 08/27/2016 0922   BUN 16 08/27/2016 0922   CREATININE 1.56 (H) 08/27/2016 0922   CALCIUM 8.8 (L) 08/27/2016 0922   GFRNONAA 44 (L) 08/27/2016 0922   GFRAA 51 (L) 08/27/2016 0922    A/P: PP PEC. Pt w/o HA or visual sx though hyperreflexia and labs deserve close monitoring. Add hepatic panel, repeat labs in am. Pt's elevated bps resolved with single dose 20mg  IV labetalol. If new neuro sx, return of high bp's can consider adding antihypertensive or Mag.  Suriah Peragine A. 08/27/2016 3:40 PM

## 2016-08-27 NOTE — Transfer of Care (Signed)
Immediate Anesthesia Transfer of Care Note  Patient: Kaitlin Ward  Procedure(s) Performed: Procedure(s): CESAREAN SECTION (N/A)  Patient Location: PACU  Anesthesia Type:Epidural  Level of Consciousness: awake, alert , oriented and patient cooperative  Airway & Oxygen Therapy: Patient Spontanous Breathing  Post-op Assessment: Report given to RN and Post -op Vital signs reviewed and stable  Post vital signs: Reviewed and stable  Last Vitals:  Vitals:   08/27/16 0348 08/27/16 0400  BP:  (!) 136/99  Pulse:  (!) 107  Resp:    Temp: 37.7 C     Last Pain:  Vitals:   08/27/16 0348  TempSrc: Axillary  PainSc:          Complications: No apparent anesthesia complications

## 2016-08-27 NOTE — Brief Op Note (Signed)
08/26/2016 - 08/27/2016  5:39 AM  PATIENT:  Kaitlin Ward  31 y.o. female  PRE-OPERATIVE DIAGNOSIS:  Cesarean section, arrest of descent  POST-OPERATIVE DIAGNOSIS: Arrest of descent  PROCEDURE:  Procedure(s): CESAREAN SECTION (N/A)  Primary low-transverse cesarean section with 2 layer closure  SURGEON:  Surgeon(s) and Role:    * Noland FordyceFogleman, Ambers Iyengar, MD - Primary  PHYSICIAN ASSISTANT:   ASSISTANTS: Sigmon CNM   ANESTHESIA:   epidural  EBL:  Total I/O In: 1000 [I.V.:1000] Out: 1200 [Urine:600; Blood:600]  BLOOD ADMINISTERED:none  DRAINS: Urinary Catheter (Foley)   LOCAL MEDICATIONS USED:  NONE  SPECIMEN:  Source of Specimen:  Placenta  DISPOSITION OF SPECIMEN:  Labor and delivery  COUNTS:  YES  TOURNIQUET:  * No tourniquets in log *  DICTATION: .Note written in EPIC  PLAN OF CARE: Admit to inpatient   PATIENT DISPOSITION:  PACU - hemodynamically stable.   Delay start of Pharmacological VTE agent (>24hrs) due to surgical blood loss or risk of bleeding: yes  Kaitlin Ward A. 08/27/2016 5:40 AM

## 2016-08-27 NOTE — Anesthesia Postprocedure Evaluation (Signed)
Anesthesia Post Note  Patient: Kaitlin Ward  Procedure(s) Performed: Procedure(s) (LRB): CESAREAN SECTION (N/A)     Patient location during evaluation: Mother Baby Anesthesia Type: Epidural Level of consciousness: awake and alert Pain management: pain level controlled Vital Signs Assessment: post-procedure vital signs reviewed and stable Respiratory status: spontaneous breathing, nonlabored ventilation and respiratory function stable Cardiovascular status: stable Postop Assessment: no headache, no backache and epidural receding Anesthetic complications: no    Last Vitals:  Vitals:   08/27/16 1010 08/27/16 1305  BP: 132/77 129/76  Pulse: 80 85  Resp: 20 18  Temp: 36.8 C 36.7 C    Last Pain:  Vitals:   08/27/16 1305  TempSrc: Oral  PainSc: 3    Pain Goal: Patients Stated Pain Goal: 2 (08/27/16 1305)               Junious SilkGILBERT,Avien Taha

## 2016-08-27 NOTE — Op Note (Signed)
08/26/2016 - 08/27/2016  5:39 AM  PATIENT:  Kaitlin Ward  31 y.o. female  PRE-OPERATIVE DIAGNOSIS:  Cesarean section, arrest of descent  POST-OPERATIVE DIAGNOSIS: Arrest of descent  PROCEDURE:  Procedure(s): CESAREAN SECTION (N/A)  Primary low-transverse cesarean section with 2 layer closure  SURGEON:  Surgeon(s) and Role:    * Noland FordyceFogleman, Byrl Latin, MD - Primary  PHYSICIAN ASSISTANT:   ASSISTANTS: Sigmon CNM   ANESTHESIA:   epidural  EBL:  Total I/O In: 1000 [I.V.:1000] Out: 1200 [Urine:600; Blood:600]  BLOOD ADMINISTERED:none  DRAINS: Urinary Catheter (Foley)   LOCAL MEDICATIONS USED:  NONE  SPECIMEN:  Source of Specimen:  Placenta  DISPOSITION OF SPECIMEN:  Labor and delivery  COUNTS:  YES  TOURNIQUET:  * No tourniquets in log *  DICTATION: .Note written in EPIC  PLAN OF CARE: Admit to inpatient   PATIENT DISPOSITION:  PACU - hemodynamically stable.   Delay start of Pharmacological VTE agent (>24hrs) due to surgical blood loss or risk of bleeding: yes     Findings:  @BABYSEXEBC @ infant,  APGAR (1 MIN): 8   APGAR (5 MINS): 9   APGAR (10 MINS):   Normal uterus, tubes and ovaries, normal placenta. 3VC, thick particulate meconium, asynclitic ROP lie, bladder adhesed high to the uterus, descending colon adhesed to the anterior lateral uterine side wall and to the left adnexa  EBL: 600 cc Antibiotics:   2g Ancef Complications: none  Indications: This is a 31 y.o. year-old, G1  At 182w6d admitted for active labor. Patient presented with several hours of contractions and rupture of membranes at home and had progressed to 6 cm prior to arriving to the hospital. She actively labored and made adequate progress to 8 cm. She then made very slow progress to full dilation. After several hours of no change in 8 cm patient had an IUPC placed and then accepted Pitocin. She eventually got to 10 cm and pushed for about 2 hours. There was significant And molding which is visible  at the introitus however the fetal vertex was still at the 0 station. Risks benefits and alternatives of the procedure were discussed with the patient who agreed to proceed  Procedure:  After informed consent was obtained the patient was taken to the operating room where epidural anesthesia was found to be adequate.  She was prepped and draped in the normal sterile fashion in dorsal supine position with a leftward tilt.  A foley catheter was in place.  A Pfannenstiel skin incision was made 2 cm above the pubic symphysis in the midline with the scalpel.  Dissection was carried down with the Bovie cautery until the fascia was reached. The fascia was incised in the midline. The incision was extended laterally with the Mayo scissors. The inferior aspect of the fascial incision was grasped with the Coker clamps, elevated up and the underlying rectus muscles were dissected off sharply. The superior aspect of the fascial incision was grasped with the Coker clamps elevated up and the underlying rectus muscles were dissected off sharply.  The peritoneum was entered bluntly. The bladder was felt to be adhesed quite high to the uterus. I was reminded of patient's history of endometriosis with prior laparoscopy.  The peritoneal incision was extended superiorly and inferiorly with good visualization of the bladder. A bladder flap had to be created sharply before I was able to insert a bladder blade. The bladder blade was inserted and palpation was done to assess the fetal position and the location of the uterine  vessels. The descending colon was seen adhesed to the left anterior lateral uterine sidewall just above where the uterine incision was planned.. The bowel was retracted. Given the low fetal station the nurse placed her hand in the vagina and elevated the fetal vertex cephalad. The lower segment of the uterus was incised sharply with the scalpel and extended  bluntly in the cephalo-caudal fashion. The infant was  grasped, brought to the incision,  rotated and the infant was delivered with fundal pressure. The nose and mouth were bulb suctioned. The cord was clamped and cut after 1 minute delay. The infant was handed off to the waiting pediatrician. The placenta was expressed. The uterus was exteriorized. The uterus was cleared of all clots and debris. The uterine incision was repaired with 0 Vicryl in a running locked fashion.  A second layer of the same suture was used in an imbricating fashion to obtain excellent hemostasis. Given the prominence of the adhesion of the descending colon to the uterine wall initial attempts were made to take down this adhesion. Bleeding was noted with sharp dissection. This was then controlled with Bovie cautery and the decision was made:, As the patient does not have any GI symptoms or significant pain, to halt further dissection and plan surgery only if needed for discomfort. I will repeat look at the tubes and the ovary shown them to be in good approximation with no significant adhesive disease between the tubes and ovaries The uterus was then returned to the abdomen, the gutters were cleared of all clots and debris. The uterine incision was reinspected and found to be hemostatic. The peritoneum was grasped and closed with 2-0 Vicryl in a running fashion. The cut muscle edges and the underside of the fascia were inspected and found to be hemostatic. The fascia was closed with 0 Vicryl in a single layer . The subcutaneous tissue was irrigated. Scarpa's layer was closed with a 2-0 plain gut suture. The skin was closed with an O'Keefe needle.  The patient tolerated the procedure well. Sponge lap and needle counts were correct x3 and patient was taken to the recovery room in a stable condition.  Madalyn Legner A. 08/27/2016 5:40 AM

## 2016-08-28 LAB — CBC WITH DIFFERENTIAL/PLATELET
BASOS ABS: 0 10*3/uL (ref 0.0–0.1)
BASOS PCT: 0 %
EOS ABS: 0 10*3/uL (ref 0.0–0.7)
Eosinophils Relative: 0 %
HEMATOCRIT: 27.2 % — AB (ref 36.0–46.0)
Hemoglobin: 9.4 g/dL — ABNORMAL LOW (ref 12.0–15.0)
Lymphocytes Relative: 10 %
Lymphs Abs: 1.4 10*3/uL (ref 0.7–4.0)
MCH: 33.5 pg (ref 26.0–34.0)
MCHC: 34.6 g/dL (ref 30.0–36.0)
MCV: 96.8 fL (ref 78.0–100.0)
MONO ABS: 0.5 10*3/uL (ref 0.1–1.0)
MONOS PCT: 3 %
NEUTROS ABS: 12.2 10*3/uL — AB (ref 1.7–7.7)
Neutrophils Relative %: 87 %
PLATELETS: 108 10*3/uL — AB (ref 150–400)
RBC: 2.81 MIL/uL — ABNORMAL LOW (ref 3.87–5.11)
RDW: 13.6 % (ref 11.5–15.5)
WBC: 14 10*3/uL — ABNORMAL HIGH (ref 4.0–10.5)

## 2016-08-28 LAB — COMPREHENSIVE METABOLIC PANEL
ALBUMIN: 2.3 g/dL — AB (ref 3.5–5.0)
ALT: 12 U/L — ABNORMAL LOW (ref 14–54)
ANION GAP: 5 (ref 5–15)
AST: 31 U/L (ref 15–41)
Alkaline Phosphatase: 110 U/L (ref 38–126)
BUN: 20 mg/dL (ref 6–20)
CHLORIDE: 106 mmol/L (ref 101–111)
CO2: 25 mmol/L (ref 22–32)
Calcium: 8.2 mg/dL — ABNORMAL LOW (ref 8.9–10.3)
Creatinine, Ser: 1.35 mg/dL — ABNORMAL HIGH (ref 0.44–1.00)
GFR calc Af Amer: >60 mL/min (ref >60)
GFR calc non Af Amer: 52 mL/min — ABNORMAL LOW (ref >60)
GLUCOSE: 107 mg/dL — AB (ref 65–99)
POTASSIUM: 4.2 mmol/L (ref 3.5–5.1)
SODIUM: 136 mmol/L (ref 135–145)
TOTAL PROTEIN: 5 g/dL — AB (ref 6.5–8.1)
Total Bilirubin: 0.4 mg/dL (ref 0.3–1.2)

## 2016-08-28 MED ORDER — FAMOTIDINE 20 MG PO TABS
20.0000 mg | ORAL_TABLET | Freq: Every day | ORAL | Status: DC | PRN
  Administered 2016-08-28: 20 mg via ORAL
  Filled 2016-08-28: qty 1

## 2016-08-28 NOTE — Plan of Care (Signed)
Problem: Skin Integrity: Goal: Demonstration of wound healing without infection will improve Outcome: Completed/Met Date Met: 08/28/16 Surgical incision clean and free of s/s of infection   

## 2016-08-28 NOTE — Progress Notes (Signed)
No c/o; pain controlled, +flatus, denies h/a, vision changes, ruq pain; tolerating po Normal lochia  Temp:  [98 F (36.7 C)-98.1 F (36.7 C)] 98 F (36.7 C) (07/25 0005) Pulse Rate:  [66-88] 75 (07/25 0550) Resp:  [18] 18 (07/25 0005) BP: (115-132)/(66-83) 115/74 (07/25 0550) SpO2:  [95 %-97 %] 95 % (07/25 0005)   A&ox3 rrr ctab Abd: +bs, soft, nt, mild distension; inc: c/d/i LE: no edema, nt bilat    Intake/Output Summary (Last 24 hours) at 08/28/16 1032 Last data filed at 08/28/16 0610  Gross per 24 hour  Intake             2540 ml  Output             3225 ml  Net             -685 ml     Results for orders placed or performed during the hospital encounter of 08/26/16 (from the past 72 hour(s))  CBC     Status: None   Collection Time: 08/26/16  3:50 PM  Result Value Ref Range   WBC 7.8 4.0 - 10.5 K/uL   RBC 4.08 3.87 - 5.11 MIL/uL   Hemoglobin 13.5 12.0 - 15.0 g/dL   HCT 39.3 36.0 - 46.0 %   MCV 96.3 78.0 - 100.0 fL   MCH 33.1 26.0 - 34.0 pg   MCHC 34.4 30.0 - 36.0 g/dL   RDW 13.1 11.5 - 15.5 %   Platelets 168 150 - 400 K/uL  RPR     Status: None   Collection Time: 08/26/16  3:50 PM  Result Value Ref Range   RPR Ser Ql Non Reactive Non Reactive    Comment: (NOTE) Performed At: [facility] [place], [place] [id number] Lindon Romp MD [id number]   Type and screen [place]     Status: None   Collection Time: 08/26/16  3:50 PM  Result Value Ref Range   ABO/RH(D) A POS    Antibody Screen NEG    Sample Expiration 08/29/2016   ABO/Rh     Status: None   Collection Time: 08/26/16  3:50 PM  Result Value Ref Range   ABO/RH(D) A POS   Protein / creatinine ratio, urine     Status: Abnormal   Collection Time: 08/27/16  9:10 AM  Result Value Ref Range   Creatinine, Urine 100.00 mg/dL   Total Protein, Urine 100 mg/dL    Comment: NO NORMAL RANGE ESTABLISHED FOR THIS TEST   Protein Creatinine Ratio 1.00 (H) 0.00 -  0.15 mg/mg[Cre]  Basic metabolic panel     Status: Abnormal   Collection Time: 08/27/16  9:22 AM  Result Value Ref Range   Sodium 136 135 - 145 mmol/L   Potassium 4.2 3.5 - 5.1 mmol/L   Chloride 106 101 - 111 mmol/L   CO2 22 22 - 32 mmol/L   Glucose, Bld 111 (H) 65 - 99 mg/dL   BUN 16 6 - 20 mg/dL   Creatinine, Ser 1.56 (H) 0.44 - 1.00 mg/dL   Calcium 8.8 (L) 8.9 - 10.3 mg/dL   GFR calc non Af Amer 44 (L) >60 mL/min   GFR calc Af Amer 51 (L) >60 mL/min    Comment: (NOTE) The eGFR has been calculated using the CKD EPI equation. This calculation has not been validated in all clinical situations. eGFR's persistently <60 mL/min signify possible Chronic Kidney Disease.    Anion gap 8 5 -  15  CBC     Status: Abnormal   Collection Time: 08/27/16  9:22 AM  Result Value Ref Range   WBC 14.1 (H) 4.0 - 10.5 K/uL   RBC 3.46 (L) 3.87 - 5.11 MIL/uL   Hemoglobin 11.3 (L) 12.0 - 15.0 g/dL   HCT 33.4 (L) 36.0 - 46.0 %   MCV 96.5 78.0 - 100.0 fL   MCH 32.7 26.0 - 34.0 pg   MCHC 33.8 30.0 - 36.0 g/dL   RDW 13.5 11.5 - 15.5 %   Platelets 127 (L) 150 - 400 K/uL  Uric acid     Status: Abnormal   Collection Time: 08/27/16  9:22 AM  Result Value Ref Range   Uric Acid, Serum 9.0 (H) 2.3 - 6.6 mg/dL  Hepatic function panel     Status: Abnormal   Collection Time: 08/27/16  9:22 AM  Result Value Ref Range   Total Protein 4.9 (L) 6.5 - 8.1 g/dL   Albumin 2.5 (L) 3.5 - 5.0 g/dL   AST 34 15 - 41 U/L   ALT 12 (L) 14 - 54 U/L   Alkaline Phosphatase 149 (H) 38 - 126 U/L   Total Bilirubin 0.4 0.3 - 1.2 mg/dL   Bilirubin, Direct 0.1 0.1 - 0.5 mg/dL   Indirect Bilirubin 0.3 0.3 - 0.9 mg/dL  CBC with Differential/Platelet     Status: Abnormal   Collection Time: 08/28/16  6:04 AM  Result Value Ref Range   WBC 14.0 (H) 4.0 - 10.5 K/uL   RBC 2.81 (L) 3.87 - 5.11 MIL/uL   Hemoglobin 9.4 (L) 12.0 - 15.0 g/dL   HCT 27.2 (L) 36.0 - 46.0 %   MCV 96.8 78.0 - 100.0 fL   MCH 33.5 26.0 - 34.0 pg   MCHC 34.6  30.0 - 36.0 g/dL   RDW 13.6 11.5 - 15.5 %   Platelets 108 (L) 150 - 400 K/uL    Comment: SPECIMEN CHECKED FOR CLOTS REPEATED TO VERIFY PLATELET COUNT CONFIRMED BY SMEAR    Neutrophils Relative % 87 %   Neutro Abs 12.2 (H) 1.7 - 7.7 K/uL   Lymphocytes Relative 10 %   Lymphs Abs 1.4 0.7 - 4.0 K/uL   Monocytes Relative 3 %   Monocytes Absolute 0.5 0.1 - 1.0 K/uL   Eosinophils Relative 0 %   Eosinophils Absolute 0.0 0.0 - 0.7 K/uL   Basophils Relative 0 %   Basophils Absolute 0.0 0.0 - 0.1 K/uL  Comprehensive metabolic panel     Status: Abnormal   Collection Time: 08/28/16  6:04 AM  Result Value Ref Range   Sodium 136 135 - 145 mmol/L   Potassium 4.2 3.5 - 5.1 mmol/L   Chloride 106 101 - 111 mmol/L   CO2 25 22 - 32 mmol/L   Glucose, Bld 107 (H) 65 - 99 mg/dL   BUN 20 6 - 20 mg/dL   Creatinine, Ser 1.35 (H) 0.44 - 1.00 mg/dL   Calcium 8.2 (L) 8.9 - 10.3 mg/dL   Total Protein 5.0 (L) 6.5 - 8.1 g/dL   Albumin 2.3 (L) 3.5 - 5.0 g/dL   AST 31 15 - 41 U/L   ALT 12 (L) 14 - 54 U/L   Alkaline Phosphatase 110 38 - 126 U/L   Total Bilirubin 0.4 0.3 - 1.2 mg/dL   GFR calc non Af Amer 52 (L) >60 mL/min   GFR calc Af Amer >60 >60 mL/min    Comment: (NOTE) The eGFR has been calculated using  the CKD EPI equation. This calculation has not been validated in all clinical situations. eGFR's persistently <60 mL/min signify possible Chronic Kidney Disease.    Anion gap 5 5 - 15     a/p: pod 1 s/p ltcs for arrest of descent 1. Mild pre-e: elevated creatinine is trending down, still mildly elevated; plts now 108; pt has had good diuresis and bps normal in last 24 hrs; pt asymptomatic; appears disease process is overall improving and will contin to obs closely; suspect plts to begin to increase but will follow -- plan repeat labs in am; plan reviewed with pt; case also discussed with MFM (Dr. Burnett Harry) who also agrees with plan to obs vs mag sulfate d/t continued decrease in plts; would begin mag  sulfate if pt symptomatic and will contin to follow closely 2. Contin post op care, increase ambulation today 3. Mild acute anemia - begin iron q day pp 4. Increased creatinine - repeat labs, improving 5. Low plts - follow closely, labs in am

## 2016-08-29 ENCOUNTER — Encounter (HOSPITAL_COMMUNITY): Payer: Self-pay

## 2016-08-29 LAB — CBC WITH DIFFERENTIAL/PLATELET
BASOS ABS: 0 10*3/uL (ref 0.0–0.1)
Basophils Relative: 0 %
EOS ABS: 0 10*3/uL (ref 0.0–0.7)
EOS PCT: 0 %
HCT: 23.6 % — ABNORMAL LOW (ref 36.0–46.0)
Hemoglobin: 8.2 g/dL — ABNORMAL LOW (ref 12.0–15.0)
Lymphocytes Relative: 13 %
Lymphs Abs: 1.9 10*3/uL (ref 0.7–4.0)
MCH: 33.9 pg (ref 26.0–34.0)
MCHC: 34.7 g/dL (ref 30.0–36.0)
MCV: 97.5 fL (ref 78.0–100.0)
MONO ABS: 0.8 10*3/uL (ref 0.1–1.0)
Monocytes Relative: 5 %
Neutro Abs: 12.1 10*3/uL — ABNORMAL HIGH (ref 1.7–7.7)
Neutrophils Relative %: 82 %
PLATELETS: 124 10*3/uL — AB (ref 150–400)
RBC: 2.42 MIL/uL — AB (ref 3.87–5.11)
RDW: 14.1 % (ref 11.5–15.5)
WBC: 14.7 10*3/uL — AB (ref 4.0–10.5)

## 2016-08-29 LAB — COMPREHENSIVE METABOLIC PANEL
ALK PHOS: 103 U/L (ref 38–126)
ALT: 13 U/L — AB (ref 14–54)
AST: 26 U/L (ref 15–41)
Albumin: 2.1 g/dL — ABNORMAL LOW (ref 3.5–5.0)
Anion gap: 6 (ref 5–15)
BILIRUBIN TOTAL: 0.3 mg/dL (ref 0.3–1.2)
BUN: 23 mg/dL — AB (ref 6–20)
CO2: 24 mmol/L (ref 22–32)
CREATININE: 1 mg/dL (ref 0.44–1.00)
Calcium: 8.2 mg/dL — ABNORMAL LOW (ref 8.9–10.3)
Chloride: 109 mmol/L (ref 101–111)
GFR calc Af Amer: >60 mL/min (ref >60)
Glucose, Bld: 101 mg/dL — ABNORMAL HIGH (ref 65–99)
Potassium: 3.9 mmol/L (ref 3.5–5.1)
Sodium: 139 mmol/L (ref 135–145)
TOTAL PROTEIN: 5 g/dL — AB (ref 6.5–8.1)

## 2016-08-29 MED ORDER — POLYSACCHARIDE IRON COMPLEX 150 MG PO CAPS: 150.0000 mg | ORAL_CAPSULE | Freq: Every day | ORAL | 0 refills | Status: DC

## 2016-08-29 MED ORDER — IBUPROFEN 600 MG PO TABS: 600.0000 mg | ORAL_TABLET | Freq: Four times a day (QID) | ORAL | 0 refills | Status: DC

## 2016-08-29 MED ORDER — MAGNESIUM OXIDE 400 (241.3 MG) MG PO TABS: 400.0000 mg | ORAL_TABLET | Freq: Every day | ORAL | 0 refills | Status: DC

## 2016-08-29 NOTE — Discharge Instructions (Signed)
Iron-Rich Diet  Iron is a mineral that helps your body to produce hemoglobin. Hemoglobin is a protein in your red blood cells that carries oxygen to your body's tissues. Eating too little iron may cause you to feel weak and tired, and it can increase your risk for infection. Eating enough iron is necessary for your body's metabolism, muscle function, and nervous system.  Iron is naturally found in many foods. It can also be added to foods or fortified in foods. There are two types of dietary iron:   Heme iron. Heme iron is absorbed by the body more easily than nonheme iron. Heme iron is found in meat, poultry, and fish.   Nonheme iron. Nonheme iron is found in dietary supplements, iron-fortified grains, beans, and vegetables.    You may need to follow an iron-rich diet if:   You have been diagnosed with iron deficiency or iron-deficiency anemia.   You have a condition that prevents you from absorbing dietary iron, such as:  ? Infection in your intestines.  ? Celiac disease. This involves long-lasting (chronic) inflammation of your intestines.   You do not eat enough iron.   You eat a diet that is high in foods that impair iron absorption.   You have lost a lot of blood.   You have heavy bleeding during your menstrual cycle.   You are pregnant.    What is my plan?  Your health care provider may help you to determine how much iron you need per day based on your condition. Generally, when a person consumes sufficient amounts of iron in the diet, the following iron needs are met:   Men.  ? 14-18 years old: 11 mg per day.  ? 19-50 years old: 8 mg per day.   Women.  ? 14-18 years old: 15 mg per day.  ? 19-50 years old: 18 mg per day.  ? Over 50 years old: 8 mg per day.  ? Pregnant women: 27 mg per day.  ? Breastfeeding women: 9 mg per day.    What do I need to know about an iron-rich diet?   Eat fresh fruits and vegetables that are high in vitamin C along with foods that are high in iron. This will help  increase the amount of iron that your body absorbs from food, especially with foods containing nonheme iron. Foods that are high in vitamin C include oranges, peppers, tomatoes, and mango.   Take iron supplements only as directed by your health care provider. Overdose of iron can be life-threatening. If you were prescribed iron supplements, take them with orange juice or a vitamin C supplement.   Cook foods in pots and pans that are made from iron.   Eat nonheme iron-containing foods alongside foods that are high in heme iron. This helps to improve your iron absorption.   Certain foods and drinks contain compounds that impair iron absorption. Avoid eating these foods in the same meal as iron-rich foods or with iron supplements. These include:  ? Coffee, black tea, and red wine.  ? Milk, dairy products, and foods that are high in calcium.  ? Beans, soybeans, and peas.  ? Whole grains.   When eating foods that contain both nonheme iron and compounds that impair iron absorption, follow these tips to absorb iron better.  ? Soak beans overnight before cooking.  ? Soak whole grains overnight and drain them before using.  ? Ferment flours before baking, such as using yeast in bread dough.    What foods can I eat?  Grains  Iron-fortified breakfast cereal. Iron-fortified whole-wheat bread. Enriched rice. Sprouted grains.  Vegetables  Spinach. Potatoes with skin. Green peas. Broccoli. Red and green bell peppers. Fermented vegetables.  Fruits  Prunes. Raisins. Oranges. Strawberries. Mango. Grapefruit.  Meats and Other Protein Sources  Beef liver. Oysters. Beef. Shrimp. Turkey. Chicken. Tuna. Sardines. Chickpeas. Nuts. Tofu.  Beverages  Tomato juice. Fresh orange juice. Prune juice. Hibiscus tea. Fortified instant breakfast shakes.  Condiments  Tahini. Fermented soy sauce.  Sweets and Desserts  Black-strap molasses.  Other  Wheat germ.  The items listed above may not be a complete list of recommended foods or beverages.  Contact your dietitian for more options.  What foods are not recommended?  Grains  Whole grains. Bran cereal. Bran flour. Oats.  Vegetables  Artichokes. Brussels sprouts. Kale.  Fruits  Blueberries. Raspberries. Strawberries. Figs.  Meats and Other Protein Sources  Soybeans. Products made from soy protein.  Dairy  Milk. Cream. Cheese. Yogurt. Cottage cheese.  Beverages  Coffee. Black tea. Red wine.  Sweets and Desserts  Cocoa. Chocolate. Ice cream.  Other  Basil. Oregano. Parsley.  The items listed above may not be a complete list of foods and beverages to avoid. Contact your dietitian for more information.  This information is not intended to replace advice given to you by your health care provider. Make sure you discuss any questions you have with your health care provider.  Document Released: 09/04/2004 Document Revised: 08/11/2015 Document Reviewed: 08/18/2013  Elsevier Interactive Patient Education  2018 [employer]

## 2016-08-29 NOTE — Discharge Summary (Signed)
OB Discharge Summary  Patient Name: Kaitlin ChoKathleen Ward DOB: 09/09/1985 MRN: [id number]  Date of admission: 08/26/2016  Admitting diagnosis: labor Intrauterine pregnancy: [id number]        Date of discharge: 08/29/2016    Discharge diagnosis: Term Pregnancy Delivered, Preeclampsia (mild) and POD 2 s/p CS arrest of descent     Prenatal history: G1P1001   EDC : 08/21/2016, Alternate EDD Entry  [facility]  Primary provider : Ernestina PennaFogleman Prenatal course uncomplicated   Prenatal Labs: ABO, Rh: --/--/A POS, A POS (07/23 1550)  Antibody: NEG (07/23 1550) Rubella: Immune (12/30 0000)   RPR: Non Reactive (07/23 1550)  HBsAg: Negative (12/30 0000)  HIV: Non-reactive (12/30 0000)  GBS: Negative (06/21 0000)                                    Hospital course:  Onset of Labor With Unplanned C/S  31 y.o. yo G1P1001 at 1258w6d was admitted in Active Labor on 08/26/2016. Patient had a labor course significant for arrest of descent. Membrane Rupture Time/Date: 6:10 PM ,08/26/2016   The patient went for cesarean section due to Arrest of Descent, and delivered a Viable infant,08/27/2016  Details of operation can be found in separate operative note.   Patient had an uncomplicated postpartum course except mild symptoms of ?PEC - elevated creatinine with elevated BP and low platelets.  She is ambulating,tolerating a regular diet, passing flatus, and urinating well.  Patient is discharged home in stable condition 08/29/16.  Delivering PROVIDER: Noland FordyceFOGLEMAN, KELLY                                                            Complications: None  Newborn Data: Live born female  Birth Weight: 8 lb 12 oz (3970 g) APGAR: 8, 9  Baby Feeding: Bottle Disposition:home with mother  Post partum procedures:none  Labs: Results for Kaitlin ChoKAZAKS, [place] (MRN [id number]) as of 08/29/2016 10:15  Ref. Range 08/27/2016 09:22 08/28/2016 06:04 08/29/2016 05:45  Platelets Latest Ref Range: 150 - 400  K/uL 127 (L) 108 (L) 124 (L)    Lab Results  Component Value Date   WBC 14.7 (H) 08/29/2016   HGB 8.2 (L) 08/29/2016   HCT 23.6 (L) 08/29/2016   MCV 97.5 08/29/2016   PLT 124 (L) 08/29/2016   Results for Kaitlin ChoKAZAKS, [place] (MRN [id number]) as of 08/29/2016 10:15  Ref. Range 08/26/2016 15:50 08/27/2016 09:10 08/27/2016 09:22 08/28/2016 06:04 08/29/2016 05:45  Creatinine Latest Ref Range: 0.44 - 1.00 mg/dL   6.291.56 (H) 5.281.35 (H) 4.131.00     CMP Latest Ref Rng & Units 08/29/2016  Glucose 65 - 99 mg/dL 244(W101(H)  BUN 6 - 20 mg/dL 10(U23(H)  Creatinine 7.250.44 - 1.00 mg/dL 3.661.00  Sodium 440135 - 347145 mmol/L 139  Potassium 3.5 - 5.1 mmol/L 3.9  Chloride 101 - 111 mmol/L 109  CO2 22 - 32 mmol/L 24  Calcium 8.9 - 10.3 mg/dL 8.2(L)  Total Protein 6.5 - 8.1 g/dL 5.0(L)  Total Bilirubin 0.3 - 1.2 mg/dL 0.3  Alkaline Phos 38 - 126 U/L 103  AST 15 - 41 U/L 26  ALT 14 - 54 U/L 13(L)  Physical Exam @ time of discharge:  Vitals:   08/28/16 0550 08/28/16 1908 08/28/16 2100 08/29/16 0548  BP: 115/74 136/68  133/79  Pulse: 75 75  63  Resp:  18    Temp:  98.6 F (37 C)  98.5 F (36.9 C)  TempSrc:  Oral  Oral  SpO2:      Weight:   69.9 kg (154 lb)   Height:   5\' 5"  (1.651 m)     General: alert, cooperative and no distress Lochia: appropriate Uterine Fundus: firm Perineum: intact Incision: Healing well with no significant drainage Extremities: DVT Evaluation: No evidence of DVT seen on physical exam.   Discharge instructions:  "Baby and Me Booklet" and [facility] Booklet  Discharge Medications:  Allergies as of 08/29/2016   No Known Allergies     Medication List    TAKE these medications   folic acid 1 MG tablet Commonly known as:  FOLVITE Take 1 mg by mouth daily.   ibuprofen 600 MG tablet Commonly known as:  ADVIL,MOTRIN Take 1 tablet (600 mg total) by mouth every 6 (six) hours.   iron polysaccharides 150 MG capsule Commonly known as:  NIFEREX Take 1 capsule (150 mg total) by mouth  daily.   magnesium oxide 400 (241.3 Mg) MG tablet Commonly known as:  MAG-OX Take 1 tablet (400 mg total) by mouth daily.   prenatal multivitamin Tabs tablet Take 1 tablet by mouth daily at 12 noon.   ZANTAC PO Take 1 tablet by mouth as needed (heartburn).       Diet: routine diet  Activity: Advance as tolerated. Pelvic rest x 6 weeks.   Follow up:2 weeks    Signed: Marlinda MikeBAILEY, Ziaire Bieser CNM, MSN, [facility] 08/29/2016, 10:13 AM

## 2016-08-29 NOTE — Progress Notes (Signed)
POSTOPERATIVE DAY # 2 S/P CS   S:         Reports feeling well - no PIH symptoms             Tolerating po intake / no nausea / no vomiting / + flatus / no BM             Bleeding is light             Pain controlled with Motrin and occasional oxycodone             Up ad lib / ambulatory/ voiding QS  Newborn breast feeding    O:  VS: BP 133/79 (BP Location: Left Arm)   Pulse 63   Temp 98.5 F (36.9 C) (Oral)   Resp 18   Ht 5\' 5"  (1.651 m)   Wt 69.9 kg (154 lb)   SpO2 95%   Breastfeeding? Unknown   BMI 25.63 kg/m    BP all normal range past 24 hours   LABS:   Results for Elspeth ChoKAZAKS, Aunesty (MRN [id number]) as of 08/29/2016 09:45  Ref. Range 08/29/2016 05:45  Glucose Latest Ref Range: 65 - 99 mg/dL 914101 (H)  BUN Latest Ref Range: 6 - 20 mg/dL 23 (H)  Creatinine Latest Ref Range: 0.44 - 1.00 mg/dL 7.821.00  Calcium Latest Ref Range: 8.9 - 10.3 mg/dL 8.2 (L)  Anion gap Latest Ref Range: 5 - 15  6  Alkaline Phosphatase Latest Ref Range: 38 - 126 U/L 103  Albumin Latest Ref Range: 3.5 - 5.0 g/dL 2.1 (L)  AST Latest Ref Range: 15 - 41 U/L 26  ALT Latest Ref Range: 14 - 54 U/L 13 (L)                Recent Labs  08/28/16 0604 08/29/16 0545  WBC 14.0* 14.7*  HGB 9.4* 8.2*  PLT 108* 124*               Bloodtype: --/--/A POS, A POS (07/23 1550)  Rubella: Immune (12/30 0000)                                             I&O: Intake/Output      07/25 0701 - 07/26 0700 07/26 0701 - 07/27 0700   Urine (mL/kg/hr) 1400 (0.8)    Total Output 1400     Net -1400                     Physical Exam:             Alert and Oriented X3  Lungs: Clear and unlabored  Heart: regular rate and rhythm / no mumurs  Abdomen: soft, non-tender, non-distended             Fundus: firm, non-tender, U-1             Dressing itnact              Incision:  approximated with suture / no erythema / no ecchymosis / no drainage  Perineum: intact  Lochia: light  Extremities: no edema, no calf pain or  tenderness, negative Homans  A:        POD # 2 S/P CS            Mild PEC - normal BP and improved labs  P:  Routine postoperative care              BP recheck this weekend with home nurse / OV 1-2 weeks as indicated             DC home - WOB booklet - instructions reviewed     Marlinda MikeBAILEY, Wesly Whisenant CNM, MSN, [facility] 08/29/2016, 9:45 AM

## 2019-02-05 HISTORY — PX: DILATION AND EVACUATION: SHX1459

## 2019-02-23 ENCOUNTER — Ambulatory Visit: Payer: Managed Care, Other (non HMO) | Attending: Internal Medicine

## 2019-02-23 DIAGNOSIS — Z20822 Contact with and (suspected) exposure to covid-19: Secondary | ICD-10-CM

## 2019-02-24 LAB — NOVEL CORONAVIRUS, NAA: SARS-CoV-2, NAA: NOT DETECTED

## 2019-05-05 ENCOUNTER — Telehealth: Payer: Self-pay | Admitting: Hematology and Oncology

## 2019-05-05 NOTE — Telephone Encounter (Signed)
Received a new hem referral from Dr. Juliene Pina for Cardiolipin IGM elevated. Kaitlin Ward has been cld and scheduled to see Kaitlin Ward on 4/19 at 1pm w/labs at 2:15pm. Pt aware to arrive 15 minutes early.

## 2019-05-24 ENCOUNTER — Inpatient Hospital Stay: Attending: Hematology and Oncology | Admitting: Hematology and Oncology

## 2019-05-24 ENCOUNTER — Inpatient Hospital Stay

## 2019-05-24 ENCOUNTER — Other Ambulatory Visit: Payer: Self-pay

## 2019-05-24 VITALS — BP 134/76 | HR 72 | Temp 98.3°F | Resp 18 | Ht 65.0 in | Wt 127.0 lb

## 2019-05-24 DIAGNOSIS — R42 Dizziness and giddiness: Secondary | ICD-10-CM | POA: Diagnosis not present

## 2019-05-24 DIAGNOSIS — K219 Gastro-esophageal reflux disease without esophagitis: Secondary | ICD-10-CM | POA: Insufficient documentation

## 2019-05-24 DIAGNOSIS — N96 Recurrent pregnancy loss: Secondary | ICD-10-CM | POA: Insufficient documentation

## 2019-05-24 DIAGNOSIS — N809 Endometriosis, unspecified: Secondary | ICD-10-CM | POA: Diagnosis not present

## 2019-05-24 DIAGNOSIS — R760 Raised antibody titer: Secondary | ICD-10-CM

## 2019-05-24 LAB — CBC WITH DIFFERENTIAL (CANCER CENTER ONLY)
Abs Immature Granulocytes: 0.01 K/uL (ref 0.00–0.07)
Basophils Absolute: 0 K/uL (ref 0.0–0.1)
Basophils Relative: 0 %
Eosinophils Absolute: 0.1 K/uL (ref 0.0–0.5)
Eosinophils Relative: 1 %
HCT: 39.2 % (ref 36.0–46.0)
Hemoglobin: 12.9 g/dL (ref 12.0–15.0)
Immature Granulocytes: 0 %
Lymphocytes Relative: 45 %
Lymphs Abs: 2.3 K/uL (ref 0.7–4.0)
MCH: 31.8 pg (ref 26.0–34.0)
MCHC: 32.9 g/dL (ref 30.0–36.0)
MCV: 96.6 fL (ref 80.0–100.0)
Monocytes Absolute: 0.4 K/uL (ref 0.1–1.0)
Monocytes Relative: 7 %
Neutro Abs: 2.4 K/uL (ref 1.7–7.7)
Neutrophils Relative %: 47 %
Platelet Count: 218 K/uL (ref 150–400)
RBC: 4.06 MIL/uL (ref 3.87–5.11)
RDW: 12.4 % (ref 11.5–15.5)
WBC Count: 5.1 K/uL (ref 4.0–10.5)
nRBC: 0 % (ref 0.0–0.2)

## 2019-05-24 LAB — CMP (CANCER CENTER ONLY)
ALT: 32 U/L (ref 0–44)
AST: 26 U/L (ref 15–41)
Albumin: 4.4 g/dL (ref 3.5–5.0)
Alkaline Phosphatase: 56 U/L (ref 38–126)
Anion gap: 8 (ref 5–15)
BUN: 12 mg/dL (ref 6–20)
CO2: 26 mmol/L (ref 22–32)
Calcium: 9 mg/dL (ref 8.9–10.3)
Chloride: 104 mmol/L (ref 98–111)
Creatinine: 0.81 mg/dL (ref 0.44–1.00)
GFR, Est AFR Am: >60 mL/min
GFR, Estimated: >60 mL/min
Glucose, Bld: 129 mg/dL — ABNORMAL HIGH (ref 70–99)
Potassium: 3.8 mmol/L (ref 3.5–5.1)
Sodium: 138 mmol/L (ref 135–145)
Total Bilirubin: 0.3 mg/dL (ref 0.3–1.2)
Total Protein: 7.6 g/dL (ref 6.5–8.1)

## 2019-05-24 LAB — IRON AND TIBC
Iron: 84 ug/dL (ref 41–142)
Saturation Ratios: 30 % (ref 21–57)
TIBC: 279 ug/dL (ref 236–444)
UIBC: 195 ug/dL (ref 120–384)

## 2019-05-24 LAB — FERRITIN: Ferritin: 35 ng/mL (ref 11–307)

## 2019-05-24 NOTE — Progress Notes (Signed)
[facility] [phone]   [phone]  INITIAL CONSULT NOTE  Patient Care Team: Patient, No Pcp Per as PCP - General (General Practice)  Hematological/Oncological History # Elevated Cardiolipin IgM/ Recurrent Pregnancy Loss 1) 04/06/2019: Anticardiolipin IgM 19 (Indeterminate)  2) 04/27/2019: Anticardiolipin IgM 23 (Low-Med Positive) 3) 05/24/2019: establish care with Dr. Lorenso Courier   CHIEF COMPLAINTS/PURPOSE OF CONSULTATION:  "Elevated Cardiolipin IgM"  HISTORY OF PRESENTING ILLNESS:  Kaitlin Ward 34 y.o. female with medical history significant for endometriosis and GERD who presents for evaluation of elevated Cardiolipin IgM.  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  The patient notes that she has a 15-year-old son which she gave birth to in July 2018.  At the time she was diagnosed with stage IV endometriosis.  She has had 2 miscarriages in the last 12 months with one in November 2020 and one in January 2021.  November 2020 the patient was approximately 6 to [redacted] weeks pregnant at the time when the heartbeat stopped.  The patient was also pregnant shortly thereafter and once again had a fetal heart beat cease at approximately 6 weeks time.  In March as part of her recurrent pregnancy loss evaluation she was noted to have an anticardiolipin IgM of 19 (which is indeterminate) 3 weeks later the anticardiolipin IgM was repeated and found to be 23 which was a low medium positive.  Other testing such as lupus anticoagulant panel, beta-2 glycoprotein, factor V Leiden, prothrombin gene mutation, and MTHFR mutations were all unremarkable.  Due to concern for antiphospholipid antibody syndrome being the cause of her recurrent miscarriages she was referred to hematology for further evaluation and management.  On exam today Kaitlin Ward notes that she feels well.  She reports that she has no remarkable past  medical history other than endometriosis and these recurrent pregnancy losses.  She reports that prior to becoming pregnant she had very heavy cycles with passing of a very large number of clots.  She reports that the cycle before her pregnancy with her current child who was born in July 2018 was markedly heavy.  She denies having any overt blood clots or signs or symptoms of pregnancy loss prior to the birth of her first child.  She notes that she has no remarkable family history for for lupus, but that her paternal grandmother had rheumatoid arthritis.  There are no clotting or bleeding disorders noted within her family.  On further discussion she notes that she is a non-smoker and nonrecreational drug user.  She only consumes alcohol socially.  She reports some symptoms of tinnitus as well as vertigo and occasional fogginess in the head.  She denies having any fevers, chills, sweats, nausea, vomiting or diarrhea.  A full 10 point ROS is listed below.  MEDICAL HISTORY:  Past Medical History:  Diagnosis Date  . Endometriosis   . GERD (gastroesophageal reflux disease)   . HPV (human papilloma virus) anogenital infection   . Vaginal Pap smear, abnormal     SURGICAL HISTORY: Past Surgical History:  Procedure Laterality Date  . CESAREAN SECTION N/A 08/27/2016   Procedure: CESAREAN SECTION;  Surgeon: Aloha Gell, MD;  Location: [facility];  Service: Obstetrics;  Laterality: N/A;  . COLPOSCOPY      SOCIAL HISTORY: Social History   Socioeconomic History  . Marital status: Married    Spouse name: Not on file  . Number of children: Not on file  .  Years of education: Not on file  . Highest education level: Not on file  Occupational History  . Not on file  Tobacco Use  . Smoking status: Never Smoker  . Smokeless tobacco: Never Used  Substance and Sexual Activity  . Alcohol use: Not on file  . Drug use: Not on file  . Sexual activity: Not on file  Other Topics Concern  . Not  on file  Social History Narrative  . Not on file   Social Determinants of Health   Financial Resource Strain:   . Difficulty of Paying Living Expenses:   Food Insecurity:   . Worried About Charity fundraiser in the Last Year:   . Arboriculturist in the Last Year:   Transportation Needs:   . Film/video editor (Medical):   Marland Kitchen Lack of Transportation (Non-Medical):   Physical Activity:   . Days of Exercise per Week:   . Minutes of Exercise per Session:   Stress:   . Feeling of Stress :   Social Connections:   . Frequency of Communication with Friends and Family:   . Frequency of Social Gatherings with Friends and Family:   . Attends Religious Services:   . Active Member of Clubs or Organizations:   . Attends Archivist Meetings:   Marland Kitchen Marital Status:   Intimate Partner Violence:   . Fear of Current or Ex-Partner:   . Emotionally Abused:   Marland Kitchen Physically Abused:   . Sexually Abused:     FAMILY HISTORY: No family history on file.  ALLERGIES:  has No Known Allergies.  MEDICATIONS:  Current Outpatient Medications  Medication Sig Dispense Refill  . folic acid (FOLVITE) 1 MG tablet Take 1 mg by mouth daily.    . Prenatal Vit-Fe Fumarate-FA (PRENATAL MULTIVITAMIN) TABS tablet Take 1 tablet by mouth daily at 12 noon.     No current facility-administered medications for this visit.    REVIEW OF SYSTEMS:   Constitutional: ( - ) fevers, ( - )  chills , ( - ) night sweats Eyes: ( - ) blurriness of vision, ( - ) double vision, ( - ) watery eyes Ears, nose, mouth, throat, and face: ( - ) mucositis, ( - ) sore throat Respiratory: ( - ) cough, ( - ) dyspnea, ( - ) wheezes Cardiovascular: ( - ) palpitation, ( - ) chest discomfort, ( - ) lower extremity swelling Gastrointestinal:  ( - ) nausea, ( - ) heartburn, ( - ) change in bowel habits Skin: ( - ) abnormal skin rashes Lymphatics: ( - ) new lymphadenopathy, ( - ) easy bruising Neurological: ( - ) numbness, ( - )  tingling, ( - ) new weaknesses Behavioral/Psych: ( - ) mood change, ( - ) new changes  All other systems were reviewed with the patient and are negative.  PHYSICAL EXAMINATION: ECOG PERFORMANCE STATUS: 0 - Asymptomatic  Vitals:   05/24/19 1314  BP: 134/76  Pulse: 72  Resp: 18  Temp: 98.3 F (36.8 C)  SpO2: 100%   Filed Weights   05/24/19 1314  Weight: 127 lb (57.6 kg)    GENERAL: well appearing young Caucasian female in NAD  SKIN: skin color, texture, turgor are normal, no rashes or significant lesions EYES: conjunctiva are pink and non-injected, sclera clear LUNGS: clear to auscultation and percussion with normal breathing effort HEART: regular rate & rhythm and no murmurs and no lower extremity edema ABDOMEN: soft, non-tender, non-distended, normal bowel sounds Musculoskeletal:  no cyanosis of digits and no clubbing  PSYCH: alert & oriented x 3, fluent speech NEURO: no focal motor/sensory deficits  LABORATORY DATA:  I have reviewed the data as listed CBC Latest Ref Rng & Units 05/24/2019 08/29/2016 08/28/2016  WBC 4.0 - 10.5 K/uL 5.1 14.7(H) 14.0(H)  Hemoglobin 12.0 - 15.0 g/dL 12.9 8.2(L) 9.4(L)  Hematocrit 36.0 - 46.0 % 39.2 23.6(L) 27.2(L)  Platelets 150 - 400 K/uL 218 124(L) 108(L)    CMP Latest Ref Rng & Units 05/24/2019 08/29/2016 08/28/2016  Glucose 70 - 99 mg/dL 129(H) 101(H) 107(H)  BUN 6 - 20 mg/dL 12 23(H) 20  Creatinine 0.44 - 1.00 mg/dL 0.81 1.00 1.35(H)  Sodium 135 - 145 mmol/L 138 139 136  Potassium 3.5 - 5.1 mmol/L 3.8 3.9 4.2  Chloride 98 - 111 mmol/L 104 109 106  CO2 22 - 32 mmol/L _0 Calcium 8.9 - 10.3 mg/dL 9.0 8.2(L) 8.2(L)  Total Protein 6.5 - 8.1 g/dL 7.6 5.0(L) 5.0(L)  Total Bilirubin 0.3 - 1.2 mg/dL 0.3 0.3 0.4  Alkaline Phos 38 - 126 U/L 56 103 110  AST 15 - 41 U/L _1 ALT 0 - 44 U/L 32 13(L) 12(L)     PATHOLOGY: None relevant to review.   RADIOGRAPHIC STUDIES: None relevant to review.  No results found.  ASSESSMENT &  PLAN Laqueshia Cihlar 33 y.o. female with medical history significant for endometriosis and GERD who presents for evaluation of elevated Cardiolipin IgM.  After review of the previous labs and discussion with the patient her findings are consistent with recurrent pregnancy loss prior to the 10th week.  She has had 2 episodes of this and the etiology at this time is not clear.  As part of her evaluation she was found to have a intermediate level anticardiolipin IgM as well as a low medium positive anticardiolipin IgM of 23 3 weeks later.  At this time the patient does not meet the diagnostic criteria of antiphospholipid antibody syndrome.  She has no history of VTE and no clear prior episodes of pregnancy loss.  Although the patient does not meet the clinical criteria for APS I would be willing to consider anticoagulation therapy and preventative aspirin in the event she would become pregnant between now and the confirmatory test in June 2021.  Of note we did reorder her APS panel today in order to assure that these findings were accurate.  In the event that her APS antibodies are negative on our current testing then no further follow-up will be required in our clinic.  Listing of the hypercoagulation testing is mentioned below as well as the full diagnostic criteria for antiphospholipid antibody syndrome in the literature support section.  Hypercoagulation Testing 04/06/2019 Anticardiolipin IgM 19 (Indeterminate)   Anticardiolipin IgG <9  Factor V Leiden: negative Beta 2 Glycoprotein IgG <9  and IgM 16 (nml 0-32)    Anticardiolipin IgM 23 (Low-Med Positive) 04/27/2019  # Elevated Cardiolipin IgM --patient had indeterminate testing on 04/06/2019 followed by low positive on 04/27/2019. Beta 2 glycoprotein WNL, no lupus anticoagulant panel --will repeat APS testing and baseline CMP and CBC today  --confirmation of the diagnosis of APS requires meeting clinical and laboratory criteria based on the [place]  criteria. The laboratory criteria is for 2 positive antibody tests separated by 3 months. Clinical criteria includes recurrent miscarriage x 3 before the 10 week mark. The patient does not currently meet clinical criteria.  --recommend repeat testing in June 2021, >12 weeks  after initial positive antibody test --continue recurrent miscarriage workup with Ob/Gyn --if the patient were to become pregnant between now and June 2021 we could have a discussion regarding the risks and benefits of treatment --current literature supports the use of Lovenox (33m subq daily) and ASA (819mPO daily) as protective agents in the setting of APS in pregnancy with recurrent loss  --f/u in June 2021 for repeat APS testing or sooner if patient becomes pregnant.   Orders Placed This Encounter  Procedures  . CBC with Differential (Cancer Center Only)    Standing Status:   Future    Number of Occurrences:   1    Standing Expiration Date:   05/23/2020  . CMP ([facility])    Standing Status:   Future    Number of Occurrences:   1    Standing Expiration Date:   05/23/2020  . Cardiolipin antibodies, IgG, IgM, IgA*    Standing Status:   Future    Number of Occurrences:   1    Standing Expiration Date:   05/23/2020  . Lupus anticoagulant panel*    Standing Status:   Future    Number of Occurrences:   1    Standing Expiration Date:   05/23/2020  . Beta-2-glycoprotein i abs, IgG/M/A    Standing Status:   Future    Number of Occurrences:   1    Standing Expiration Date:   05/23/2020  . Iron and TIBC    Standing Status:   Future    Number of Occurrences:   1    Standing Expiration Date:   05/23/2020  . Ferritin    Standing Status:   Future    Number of Occurrences:   1    Standing Expiration Date:   05/23/2020    All questions were answered. The patient knows to call the clinic with any problems, questions or concerns.  A total of more than 60 minutes were spent on this encounter and over half of that time  was spent on counseling and coordination of care as outlined above.   JoLedell PeoplesMD Department of Hematology/Oncology [facility] WeGastro[facility] Endoscopy Centerhone: [phone]: [phone]: [email or web address]  05/25/2019 10:37 AM   Literature Support:  MiConni SlipperD, AtIleana RoupBranch DW, Brey RL, CeCypressDerksen RH, DE GrChillicotheKoMiguel DibbleMeroni PL, Reber G, ShWelshTincani A, Vlachoyiannopoulos PG, Krilis SASouth CarolinaInternational consensus statement on an update of the classification criteria for definite antiphospholipid syndrome (APS). J Thromb Haemost. 2006 Feb;4(2):295-306.  --Antiphospholipid antibody syndrome (APS) is present if at least one of the clinical criteria and one of the laboratory criteria that follow are met* Clinical criteria 1. Vascular thrombosis One or more clinical episodes of arterial, venous, or small vessel thrombosis, in any tissue or organ. Thrombosis must be confirmed by objective validated criteria (i.e. unequivocal findings of appropriate imaging studies or histopathology). For histopathologic confirmation, thrombosis should be present without significant evidence of inflammation in the vessel wall. 2. Pregnancy morbidity ?(a) One or more unexplained deaths of a morphologically normal fetus at or beyond the 10th week of gestation, with normal fetal morphology documented by ultrasound or by direct examination of the fetus, or ?(b) One or more premature births of a morphologically normal neonate before the 34th week of gestation because of: (i) eclampsia or severe pre?eclampsia defined according to standard definitions, or (ii) recognized features of placental insufficiency or ?(c) Three or more  unexplained consecutive spontaneous abortions before the 10th week of gestation, with maternal anatomic or hormonal abnormalities and paternal and maternal chromosomal causes excluded. In studies of populations of patients who  have more than one type of pregnancy morbidity, investigators are strongly encouraged to stratify groups of subjects according to a, b, or c above.   Laboratory criteria 1. Lupus anticoagulant (LA) present in plasma, on two or more occasions at least 12?weeks apart, detected according to the guidelines of the International Society on Thrombosis and Haemostasis (Scientific Subcommittee on LAs/phospholipid?dependent antibodies) . 2. Anticardiolipin (aCL) antibody of IgG and/or IgM isotype in serum or plasma, present in medium or high titer (i.e. >40 GPL or MPL, or >the 99th percentile), on two or more occasions, at least 12?weeks apart, measured by a standardized ELISA . 3. Anti??2 glycoprotein?I antibody of IgG and/or IgM isotype in serum or plasma (in titer >the 99th percentile), present on two or more occasions, at least 12?weeks apart, measured by a standardized ELISA, according to recommended procedures.   Keturah Shavers, Regan L. Randomised controlled trial of aspirin and aspirin plus heparin in pregnant women with recurrent miscarriage associated with phospholipid antibodies (or antiphospholipid antibodies). BMJ. 1997 Jan 25;314(7076):253-7.  --Treatment with aspirin and heparin leads to a significantly higher rate of live births in women with a history of recurrent miscarriage associated with phospholipid antibodies than that achieved with aspirin alone.

## 2019-05-25 ENCOUNTER — Encounter: Payer: Self-pay | Admitting: Hematology and Oncology

## 2019-05-25 LAB — LUPUS ANTICOAGULANT PANEL
DRVVT: 36.6 s (ref 0.0–47.0)
PTT Lupus Anticoagulant: 40.7 s (ref 0.0–51.9)

## 2019-05-25 LAB — BETA-2-GLYCOPROTEIN I ABS, IGG/M/A
Beta-2 Glyco I IgG: <9 GPI IgG units (ref 0–20)
Beta-2-Glycoprotein I IgA: <9 GPI IgA units (ref 0–25)
Beta-2-Glycoprotein I IgM: 12 GPI IgM units (ref 0–32)

## 2019-05-25 LAB — CARDIOLIPIN ANTIBODIES, IGG, IGM, IGA
Anticardiolipin IgA: <9 APL U/mL (ref 0–11)
Anticardiolipin IgG: 13 GPL U/mL (ref 0–14)
Anticardiolipin IgM: 26 MPL U/mL — ABNORMAL HIGH (ref 0–12)

## 2019-05-26 ENCOUNTER — Telehealth: Payer: Self-pay | Admitting: Hematology and Oncology

## 2019-05-26 NOTE — Telephone Encounter (Signed)
Scheduled per los. Called and left msg. Mailed printout  °

## 2019-06-28 DIAGNOSIS — N809 Endometriosis, unspecified: Secondary | ICD-10-CM | POA: Insufficient documentation

## 2019-06-28 DIAGNOSIS — R87619 Unspecified abnormal cytological findings in specimens from cervix uteri: Secondary | ICD-10-CM | POA: Insufficient documentation

## 2019-06-30 ENCOUNTER — Encounter: Payer: Self-pay | Admitting: Hematology and Oncology

## 2019-07-02 ENCOUNTER — Telehealth: Payer: Self-pay | Admitting: Hematology and Oncology

## 2019-07-02 MED ORDER — ENOXAPARIN SODIUM 40 MG/0.4ML ~~LOC~~ SOLN: 40.0000 mg | SUBCUTANEOUS | 8 refills | Status: DC

## 2019-07-02 NOTE — Telephone Encounter (Signed)
Returned Mrs. Fredrich Birks call regarding her positive pregnancy test.  At this time it is not clear if the patient has antiphospholipid antibody syndrome, though she has had 2 miscarriages and has a low medium positive anticardiolipin titer.  Given these findings I do think it would be reasonable to start therapy with prophylactic Lovenox 40 mg subcu every 24 hours.  The patient has already been taking baby aspirin 81 mg once daily and notes she has tolerated it well with only some minor bruising on her upper extremities and lower extremities.  I expressed to Ms. Venetia Maxon that it does not entirely clear that APS or coagulopathy are the cause of her recurrent miscarriages.  I also noted that Lovenox is not a harmless medication and that it does increase her bleeding risk.  She voiced her understanding of these findings and wanted to proceed with Lovenox therapy.  I will call the prescription to her local pharmacy and have made her the offer that she may come to our clinic in order to be taught how to properly use heparin therapy.  All questions and concerns were answered.  We will plan to see the patient back on July 19, 2019 for repeat testing of her antiphospholipid antibodies.  Ulysees Barns, MD Department of Hematology/Oncology [facility] Phone: [phone] Pager: [phone] Email: [email or web address]

## 2019-07-18 ENCOUNTER — Other Ambulatory Visit: Payer: Self-pay | Admitting: Hematology and Oncology

## 2019-07-18 DIAGNOSIS — N96 Recurrent pregnancy loss: Secondary | ICD-10-CM

## 2019-07-18 NOTE — Progress Notes (Signed)
[place] [phone]   [phone]  PROGRESS NOTE  Patient Care Team: Patient, No Pcp Per as PCP - General (General Practice)  Hematological/Oncological History # Elevated Cardiolipin IgM/ Recurrent Pregnancy Loss 1) 04/06/2019: Anticardiolipin IgM 19 (Indeterminate)  2) 04/27/2019: Anticardiolipin IgM 23 (Low-Med Positive) 3) 05/24/2019: establish care with Dr. Lorenso Courier. Patient started ASA 51m PO daily.   4) 07/02/2019: patient found she was pregnant. Started on lovenox 428msubq daily.  5) 07/20/2019: Scheduled for 6 week ultrasound.   Interval History:  KaNainika Newlun89.o. female with medical history significant for 2 miscarriages (in a one year period) who presents for a follow up visit. The patient's last visit was on 05/24/2019 when she established care. In the interim since the last visit the patient was found to have a positive pregnancy test and on 07/02/2019 she was started on lovenox prophylaxis. She had already been taking ASA 8134mO daily since our prior visit.  On exam today Mrs. KazFelicettiports she has been doing well with Lovenox therapy.  She notes that initially she was having issues with bruising on her abdomen and thinks this may be due to #1 refrigerating the Lovenox, #2 injecting too quickly, #3 pushing out the air bubble prior to injecting.  She has altered her administration technique and has had minimal bruising since.  She still has some large residual bruising, but overall has tolerated the Lovenox shots well.  She denies having any issues with nosebleeds, dark stools, or other sites of bleeding.  She notes that she has been tolerating the pregnancy well with no morning sickness or lower extremity edema.  She is quite anxious because tomorrow she is scheduled for her first 6-week ultrasound.  She currently denies having any issues with fevers, chills, sweats, nausea, vomiting or diarrhea.  A full 10 point ROS is listed  below.  MEDICAL HISTORY:  Past Medical History:  Diagnosis Date  . Endometriosis   . GERD (gastroesophageal reflux disease)   . HPV (human papilloma virus) anogenital infection   . Vaginal Pap smear, abnormal     SURGICAL HISTORY: Past Surgical History:  Procedure Laterality Date  . CESAREAN SECTION N/A 08/27/2016   Procedure: CESAREAN SECTION;  Surgeon: FogAloha GellD;  [facility];  Laterality: N/A;  . COLPOSCOPY      SOCIAL HISTORY: Social History   Socioeconomic History  . Marital status: Married    Spouse name: Not on file  . Number of children: 1  . Years of education: Not on file  . Highest education level: Not on file  Occupational History  . Not on file  Tobacco Use  . Smoking status: Never Smoker  . Smokeless tobacco: Never Used  Substance and Sexual Activity  . Alcohol use: Not on file  . Drug use: Not on file  . Sexual activity: Not on file  Other Topics Concern  . Not on file  Social History Narrative  . Not on file   Social Determinants of Health   Financial Resource Strain:   . Difficulty of Paying Living Expenses:   Food Insecurity:   . Worried About RunCharity fundraiser the Last Year:   . RanArboriculturist the Last Year:   Transportation Needs:   . LacFilm/video editoredical):   . LMarland Kitchenck of Transportation (Non-Medical):   Physical Activity:   . Days of Exercise per Week:   . Minutes of Exercise per  Session:   Stress:   . Feeling of Stress :   Social Connections:   . Frequency of Communication with Friends and Family:   . Frequency of Social Gatherings with Friends and Family:   . Attends Religious Services:   . Active Member of Clubs or Organizations:   . Attends Archivist Meetings:   Marland Kitchen Marital Status:   Intimate Partner Violence:   . Fear of Current or Ex-Partner:   . Emotionally Abused:   Marland Kitchen Physically Abused:   . Sexually Abused:     FAMILY HISTORY: Family History   Problem Relation Age of Onset  . Vascular Disease Father   . Hypertension Maternal Grandmother   . Hypertension Paternal Grandmother   . Rheum arthritis Paternal Grandmother     ALLERGIES:  has No Known Allergies.  MEDICATIONS:  Current Outpatient Medications  Medication Sig Dispense Refill  . albuterol (VENTOLIN HFA) 108 (90 Base) MCG/ACT inhaler SMARTSIG:2 Puff(s) By Mouth Every 4 Hours PRN    . enoxaparin (LOVENOX) 40 MG/0.4ML injection Inject 0.4 mLs (40 mg total) into the skin daily. 12 mL 8  . folic acid (FOLVITE) 1 MG tablet Take 1 mg by mouth daily.    . predniSONE (DELTASONE) 5 MG tablet prednisone 5 mg tablets in a dose pack  Take 1 dose pk by oral route for 6 days.    . Prenatal Vit-Fe Fumarate-FA (PRENATAL MULTIVITAMIN) TABS tablet Take 1 tablet by mouth daily at 12 noon.    . progesterone (PROMETRIUM) 200 MG capsule Take 200 mg by mouth at bedtime.    Marland Kitchen letrozole [facility]) 2.5 MG tablet  (Patient not taking: Reported on 07/19/2019)    . OVIDREL 250 MCG/0.5ML injection Inject 0.5 mLs into the skin once. (Patient not taking: Reported on 07/19/2019)     No current facility-administered medications for this visit.    REVIEW OF SYSTEMS:   Constitutional: ( - ) fevers, ( - )  chills , ( - ) night sweats Eyes: ( - ) blurriness of vision, ( - ) double vision, ( - ) watery eyes Ears, nose, mouth, throat, and face: ( - ) mucositis, ( - ) sore throat Respiratory: ( - ) cough, ( - ) dyspnea, ( - ) wheezes Cardiovascular: ( - ) palpitation, ( - ) chest discomfort, ( - ) lower extremity swelling Gastrointestinal:  ( - ) nausea, ( - ) heartburn, ( - ) change in bowel habits Skin: ( - ) abnormal skin rashes Lymphatics: ( - ) new lymphadenopathy, ( - ) easy bruising Neurological: ( - ) numbness, ( - ) tingling, ( - ) new weaknesses Behavioral/Psych: ( - ) mood change, ( - ) new changes  All other systems were reviewed with the patient and are negative.  PHYSICAL EXAMINATION: ECOG  PERFORMANCE STATUS: 0 - Asymptomatic  Vitals:   07/19/19 0941  BP: 132/80  Pulse: 70  Resp: 16  Temp: 97.9 F (36.6 C)  SpO2: 100%   Filed Weights   07/19/19 0941  Weight: 123 lb 1.6 oz (55.8 kg)    GENERAL: well appearing middle aged Caucasian female. alert, no distress and comfortable SKIN: marked bruising on the abdomen in setting of lovenox shots. Otheriwise skin color, texture, turgor are normal, no rashes or significant lesions EYES: conjunctiva are pink and non-injected, sclera clearl LUNGS: clear to auscultation and percussion with normal breathing effort HEART: regular rate & rhythm and no murmurs and no lower extremity edema ABDOMEN: soft, non-tender, non-distended, normal bowel  sounds. Bruising as noted above.  Musculoskeletal: no cyanosis of digits and no clubbing  PSYCH: alert & oriented x 3, fluent speech NEURO: no focal motor/sensory deficits  LABORATORY DATA:  I have reviewed the data as listed CBC Latest Ref Rng & Units 07/19/2019 05/24/2019 08/29/2016  WBC 4.0 - 10.5 K/uL 5.6 5.1 14.7(H)  Hemoglobin 12.0 - 15.0 g/dL 12.1 12.9 8.2(L)  Hematocrit 36 - 46 % 35.7(L) 39.2 23.6(L)  Platelets 150 - 400 K/uL 217 218 124(L)    CMP Latest Ref Rng & Units 05/24/2019 08/29/2016 08/28/2016  Glucose 70 - 99 mg/dL 129(H) 101(H) 107(H)  BUN 6 - 20 mg/dL 12 23(H) 20  Creatinine 0.44 - 1.00 mg/dL 0.81 1.00 1.35(H)  Sodium 135 - 145 mmol/L 138 139 136  Potassium 3.5 - 5.1 mmol/L 3.8 3.9 4.2  Chloride 98 - 111 mmol/L 104 109 106  CO2 22 - 32 mmol/L _0 Calcium 8.9 - 10.3 mg/dL 9.0 8.2(L) 8.2(L)  Total Protein 6.5 - 8.1 g/dL 7.6 5.0(L) 5.0(L)  Total Bilirubin 0.3 - 1.2 mg/dL 0.3 0.3 0.4  Alkaline Phos 38 - 126 U/L 56 103 110  AST 15 - 41 U/L _1 ALT 0 - 44 U/L 32 13(L) 12(L)    RADIOGRAPHIC STUDIES: No results found.  ASSESSMENT & PLAN Cheynne Virden 34 y.o. 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  After review of the previous labs and discussion with the patient her findings are consistent with recurrent pregnancy loss prior to the 10th week.  She has had 2 episodes of this and the etiology at this time is not clear.  As part of her evaluation she was found to have a intermediate level anticardiolipin IgM as well as a low medium positive anticardiolipin IgM of 23 about 3 weeks later.  At this time the patient does not meet the diagnostic criteria of antiphospholipid antibody syndrome.  She has no history of VTE and no clear prior episodes of pregnancy loss.  Although the patient does not meet the clinical criteria for APS I was agreeable to anticoagulation therapy and preventative aspirin as she became pregnant between our initial visit and the confirmatory test planned for today.   Listing of the hypercoagulation testing is mentioned below as well as the full diagnostic criteria for antiphospholipid antibody syndrome in the literature support section.  She is agreeable to continuing on Lovenox and aspirin therapy, though we have been clear in our discussions that she does not currently meet the criteria for antiphospholipid antibody syndrome and it is unclear if this is providing any benefit.  We also discussed that if the anticardiolipin antibody today comes back negative that there would be no strong indication to continue Lovenox therapy, but I would leave this decision in her hands as it is unclear if there is a benefit from Lovenox in her particular situation.  She voiced her understanding of these findings and is open to discussion once our lab results become available.  Hypercoagulation Testing 04/06/2019 Anticardiolipin IgM 19 (Indeterminate)   Anticardiolipin IgG <9  Factor V Leiden: negative Beta 2 Glycoprotein IgG <9  and IgM 16 (nml 0-32)    Anticardiolipin IgM 23 (Low-Med Positive) 04/27/2019  # Elevated Cardiolipin IgM in the  Setting of 2 miscarriages --patient had indeterminate testing on 04/06/2019 followed by low positive on 04/27/2019. Beta 2 glycoprotein WNL, no lupus anticoagulant panel --confirmation  of the diagnosis of APS requires meeting clinical and laboratory criteria based on the [place] criteria. The laboratory criteria is for 2 positive antibody tests separated by 3 months. Clinical criteria includes recurrent miscarriage x 3 before the 10 week mark. The patient does not currently meet clinical or laboratory criteria.  --will repeat APS testing today , as we are now >12 weeks after initial positive antibody test --continue recurrent miscarriage workup with Ob/Gyn --after a discussion regarding the risks and benefits of treatment, we agreed to move forward with the use of Lovenox (3m subq daily) and ASA (893mPO daily) as protective agents in the setting of APS in pregnancy with recurrent loss. These recommendations are supported by the literature.   --f/u in 3 months time to reassess how she is tolerating blood thinners.   No orders of the defined types were placed in this encounter.  All questions were answered. The patient knows to call the clinic with any problems, questions or concerns.  A total of more than 25 minutes were spent on this encounter and over half of that time was spent on counseling and coordination of care as outlined above.   JoLedell PeoplesMD Department of Hematology/Oncology CoPlantationt WeMountain Laurel Surgery Center LLChone: [phone]: [phone]: [email or web address]  07/19/2019 10:36 AM   Literature Support:  MiConni SlipperD, AtIleana RoupBranch DW, Brey RL, CeKendletonDerksen RH, DE GrNewtownKoPlumas LakeMeroni PL, Reber G, ShFriars PointTincani A, Vlachoyiannopoulos PG, Krilis SASouth CarolinaInternational consensus statement on an update of the classification criteria for definite antiphospholipid syndrome (APS). J Thromb Haemost. 2006  Feb;4(2):295-306.  --Antiphospholipid antibody syndrome (APS) is present if at least one of the clinical criteria and one of the laboratory criteria that follow are met* Clinical criteria 1. Vascular thrombosis One or more clinical episodes of arterial, venous, or small vessel thrombosis, in any tissue or organ. Thrombosis must be confirmed by objective validated criteria (i.e. unequivocal findings of appropriate imaging studies or histopathology). For histopathologic confirmation, thrombosis should be present without significant evidence of inflammation in the vessel wall. 2. Pregnancy morbidity ?(a) One or more unexplained deaths of a morphologically normal fetus at or beyond the 10th week of gestation, with normal fetal morphology documented by ultrasound or by direct examination of the fetus, or ?(b) One or more premature births of a morphologically normal neonate before the 34th week of gestation because of: (i) eclampsia or severe pre?eclampsia defined according to standard definitions, or (ii) recognized features of placental insufficiency or ?(c) Three or more unexplained consecutive spontaneous abortions before the 10th week of gestation, with maternal anatomic or hormonal abnormalities and paternal and maternal chromosomal causes excluded. In studies of populations of patients who have more than one type of pregnancy morbidity, investigators are strongly encouraged to stratify groups of subjects according to a, b, or c above.   Laboratory criteria 1. Lupus anticoagulant (LA) present in plasma, on two or more occasions at least 12?weeks apart, detected according to the guidelines of the International Society on Thrombosis and Haemostasis (Scientific Subcommittee on LAs/phospholipid?dependent antibodies) . 2. Anticardiolipin (aCL) antibody of IgG and/or IgM isotype in serum or plasma, present in medium or high titer (i.e. >40 GPL or MPL, or >the 99th percentile), on two or more occasions, at  least 12?weeks apart, measured by a standardized ELISA . 3. Anti??2 glycoprotein?I antibody of IgG and/or IgM isotype in serum or plasma (in titer >the 99th percentile), present on two or  more occasions, at least 12?weeks apart, measured by a standardized ELISA, according to recommended procedures.  Keturah Shavers, Regan L. Randomised controlled trial of aspirin and aspirin plus heparin in pregnant women with recurrent miscarriage associated with phospholipid antibodies (or antiphospholipid antibodies). BMJ. 1997 Jan 25;314(7076):253-7.  --Treatment with aspirin and heparin leads to a significantly higher rate of live births in women with a history of recurrent miscarriage associated with phospholipid antibodies than that achieved with aspirin alone.

## 2019-07-19 ENCOUNTER — Other Ambulatory Visit: Payer: Self-pay

## 2019-07-19 ENCOUNTER — Inpatient Hospital Stay: Attending: Hematology and Oncology

## 2019-07-19 ENCOUNTER — Encounter: Payer: Self-pay | Admitting: Hematology and Oncology

## 2019-07-19 ENCOUNTER — Inpatient Hospital Stay (HOSPITAL_BASED_OUTPATIENT_CLINIC_OR_DEPARTMENT_OTHER): Admitting: Hematology and Oncology

## 2019-07-19 VITALS — BP 132/80 | HR 70 | Temp 97.9°F | Resp 16 | Ht 65.0 in | Wt 123.1 lb

## 2019-07-19 DIAGNOSIS — R760 Raised antibody titer: Secondary | ICD-10-CM

## 2019-07-19 DIAGNOSIS — O99113 Other diseases of the blood and blood-forming organs and certain disorders involving the immune mechanism complicating pregnancy, third trimester: Secondary | ICD-10-CM | POA: Diagnosis not present

## 2019-07-19 DIAGNOSIS — Z7901 Long term (current) use of anticoagulants: Secondary | ICD-10-CM | POA: Diagnosis not present

## 2019-07-19 DIAGNOSIS — Z79899 Other long term (current) drug therapy: Secondary | ICD-10-CM | POA: Diagnosis not present

## 2019-07-19 DIAGNOSIS — Z3A34 34 weeks gestation of pregnancy: Secondary | ICD-10-CM | POA: Diagnosis not present

## 2019-07-19 DIAGNOSIS — N96 Recurrent pregnancy loss: Secondary | ICD-10-CM

## 2019-07-19 DIAGNOSIS — D6861 Antiphospholipid syndrome: Secondary | ICD-10-CM | POA: Insufficient documentation

## 2019-07-19 DIAGNOSIS — R768 Other specified abnormal immunological findings in serum: Secondary | ICD-10-CM | POA: Diagnosis not present

## 2019-07-19 DIAGNOSIS — Z8261 Family history of arthritis: Secondary | ICD-10-CM | POA: Insufficient documentation

## 2019-07-19 DIAGNOSIS — Z8249 Family history of ischemic heart disease and other diseases of the circulatory system: Secondary | ICD-10-CM | POA: Insufficient documentation

## 2019-07-19 LAB — CBC WITH DIFFERENTIAL (CANCER CENTER ONLY)
Abs Immature Granulocytes: 0.02 10*3/uL (ref 0.00–0.07)
Basophils Absolute: 0 10*3/uL (ref 0.0–0.1)
Basophils Relative: 0 %
Eosinophils Absolute: 0 10*3/uL (ref 0.0–0.5)
Eosinophils Relative: 1 %
HCT: 35.7 % — ABNORMAL LOW (ref 36.0–46.0)
Hemoglobin: 12.1 g/dL (ref 12.0–15.0)
Immature Granulocytes: 0 %
Lymphocytes Relative: 35 %
Lymphs Abs: 2 10*3/uL (ref 0.7–4.0)
MCH: 31.8 pg (ref 26.0–34.0)
MCHC: 33.9 g/dL (ref 30.0–36.0)
MCV: 93.9 fL (ref 80.0–100.0)
Monocytes Absolute: 0.3 10*3/uL (ref 0.1–1.0)
Monocytes Relative: 6 %
Neutro Abs: 3.3 10*3/uL (ref 1.7–7.7)
Neutrophils Relative %: 58 %
Platelet Count: 217 10*3/uL (ref 150–400)
RBC: 3.8 MIL/uL — ABNORMAL LOW (ref 3.87–5.11)
RDW: 12.5 % (ref 11.5–15.5)
WBC Count: 5.6 10*3/uL (ref 4.0–10.5)
nRBC: 0 % (ref 0.0–0.2)

## 2019-07-19 LAB — CMP (CANCER CENTER ONLY)
ALT: 24 U/L (ref 0–44)
AST: 18 U/L (ref 15–41)
Albumin: 4.2 g/dL (ref 3.5–5.0)
Alkaline Phosphatase: 39 U/L (ref 38–126)
Anion gap: 10 (ref 5–15)
BUN: 12 mg/dL (ref 6–20)
CO2: 24 mmol/L (ref 22–32)
Calcium: 9.6 mg/dL (ref 8.9–10.3)
Chloride: 105 mmol/L (ref 98–111)
Creatinine: 0.78 mg/dL (ref 0.44–1.00)
GFR, Est AFR Am: >60 mL/min (ref >60)
GFR, Estimated: >60 mL/min (ref >60)
Glucose, Bld: 88 mg/dL (ref 70–99)
Potassium: 4 mmol/L (ref 3.5–5.1)
Sodium: 139 mmol/L (ref 135–145)
Total Bilirubin: 0.5 mg/dL (ref 0.3–1.2)
Total Protein: 7.3 g/dL (ref 6.5–8.1)

## 2019-07-20 LAB — CARDIOLIPIN ANTIBODIES, IGG, IGM, IGA
Anticardiolipin IgA: <9 APL U/mL (ref 0–11)
Anticardiolipin IgG: 16 GPL U/mL — ABNORMAL HIGH (ref 0–14)
Anticardiolipin IgM: 20 MPL U/mL — ABNORMAL HIGH (ref 0–12)

## 2019-07-20 LAB — BETA-2-GLYCOPROTEIN I ABS, IGG/M/A
Beta-2 Glyco I IgG: <9 GPI IgG units (ref 0–20)
Beta-2-Glycoprotein I IgA: <9 GPI IgA units (ref 0–25)
Beta-2-Glycoprotein I IgM: 9 GPI IgM units (ref 0–32)

## 2019-07-22 ENCOUNTER — Telehealth: Payer: Self-pay | Admitting: *Deleted

## 2019-07-22 NOTE — Telephone Encounter (Signed)
-----   Message from Jaci Standard, MD sent at 07/21/2019  9:32 AM EDT ----- Please let Mrs. Anspaugh know that her Anticardiolipin antibody was positive again, at an intermediate level of 20.   At this time I would recommend she continue the lovenox and aspirin for the duration of the pregnancy. She should have had a [place] with her OB/GYN yesterday to assess for viability. Please ask what the findings were at that time.   Azucena Freed  ----- Message ----- From: Leory Plowman, Lab In [place] Sent: 07/19/2019   9:34 AM EDT To: Jaci Standard, MD

## 2019-07-22 NOTE — Telephone Encounter (Signed)
TCT patient regarding lab results.  Spoke with her.  Informed her that her anticardiolipin antibody is positive again @ an intermediate level of 20.  Dr. Leonides Schanz recommends she continue her Lovenox and aspirin daily.  She states she is doing better with the lovenox and that it is going ok.  She did say she had an [place] on Tuesday, 07/20/19.  She states that the estimated gestation is 5 weeks not the expected 6 weeks and the fetal heart is low @ 99. She is to get another [place] on 07/27/19 to see if there has been anymore growth and increase in heart rate.  Advised her to call us if there is any change in her pregnancy.  Her next scheduled appt with [place] is 10/18/19. She is aware of this appt.

## 2019-07-26 ENCOUNTER — Other Ambulatory Visit

## 2019-07-26 ENCOUNTER — Ambulatory Visit: Admitting: Hematology and Oncology

## 2019-08-02 ENCOUNTER — Encounter: Payer: Self-pay | Admitting: Hematology and Oncology

## 2019-08-06 ENCOUNTER — Encounter: Payer: Self-pay | Admitting: Hematology and Oncology

## 2019-08-11 ENCOUNTER — Encounter: Payer: Self-pay | Admitting: Hematology and Oncology

## 2019-08-11 NOTE — H&P (Signed)
Kaitlin Ward is an 34 y.o. female. presenting for missed abortion. Pregnancy conceived with femara + ovidrel. She measured 7.6 wga on [place] but was 9.0 wga by sure dates. No FHT noted when previously present. She was on lovenox, progesterone and asa for RPL. She discontinued those, last doses Wed AM (48 hours ago from surgery). She desire annovera testing today. She has a hx severe endometriosis requiring bowel dissection at the time of her CS.   [id number]  Past Medical History:  Diagnosis Date  . Endometriosis   . GERD (gastroesophageal reflux disease)   . HPV (human papilloma virus) anogenital infection   . Vaginal Pap smear, abnormal     Past Surgical History:  Procedure Laterality Date  . CESAREAN SECTION N/A 08/27/2016   Procedure: CESAREAN SECTION;  Surgeon: Noland Fordyce, MD;  Location: [facility] BIR[facility]ING SU[facility];  Service: Obstetrics;  Laterality: N/A;  . COLPOSCOPY      Family History  Problem Relation Age of Onset  . Vascular Disease Father   . Hypertension Maternal Grandmother   . Hypertension Paternal Grandmother   . Rheum arthritis Paternal Grandmother     Social History:  reports that she has never smoked. She has never used smokeless tobacco. No history on file for alcohol use and drug use.  Allergies: No Known Allergies  No medications prior to admission.    Review of Systems  unknown if currently breastfeeding. Physical Exam  Gen: well appearing, NAD CV: Reg rate Pulm: NWOB Abd: soft, nondistended, nontender, no masses GYN: uterus 7 week size, no adnexa ttp/CMT Ext: No edema b/l  No results found for this or any previous visit (from the past 24 hour(s)).  No results found.  Assessment/Plan: Pt counseled on TVUS results and diagnosis of MAB. Discussed that it is unlikely to be her fault nor could she have prevented it. Reviewed that this miscarriage does not likely reflect her ability to have a successful pregnancy in the future, and that miscarriage is common  - 1:5 pregnancies. She was counseled on options for managing missed ab including expectant, medical, and surgical management.  Patient desires definitive surgical management with suction dilation and curettage with genetic testing.  Risks/benefits/ and alternatives reviewed with patient with risks including but not limited to bleeding, infection, uterine perforation, and damage to nearby structures such as the bowel, bladder, vessels, and/or other organs. She was given opportunity to ask questions and all questions answered. Patient consents to proceed with suction dilation and curettage. Reviewed bleeding precautions. Her blood type is rh positive and she does not need Rhogam. Given instructions and she verbalizes understanding. Discussed the importance of having at least one normal periods after completion of the process, before attempting conception again. Discussed S/S to call back for. All questions answered and pt verbalizes understanding w/out further questions/concerns. Risks discussed including infection, bleeding, damage to surrounding structures, need for additional procedures, postoperative DVT and subsequent scarring. All questions answered. Consent signed in office.  Plan on doxycycline postop and consider cytotec x 3 days and rx was already given.   Madelaine Etienne Jaecion Dempster 08/11/2019, 1:17 PM

## 2019-08-12 ENCOUNTER — Other Ambulatory Visit: Payer: Self-pay

## 2019-08-12 ENCOUNTER — Other Ambulatory Visit (HOSPITAL_COMMUNITY)
Admission: RE | Admit: 2019-08-12 | Discharge: 2019-08-12 | Disposition: A | Discharge status: Home / Self Care | Source: Ambulatory Visit | Attending: Obstetrics and Gynecology | Admitting: Obstetrics and Gynecology

## 2019-08-12 ENCOUNTER — Encounter (HOSPITAL_COMMUNITY): Payer: Self-pay | Admitting: Obstetrics and Gynecology

## 2019-08-12 DIAGNOSIS — Z01812 Encounter for preprocedural laboratory examination: Secondary | ICD-10-CM | POA: Diagnosis not present

## 2019-08-12 DIAGNOSIS — Z20822 Contact with and (suspected) exposure to covid-19: Secondary | ICD-10-CM | POA: Diagnosis not present

## 2019-08-12 LAB — SARS CORONAVIRUS 2 (TAT 6-24 HRS): SARS Coronavirus 2: NEGATIVE

## 2019-08-12 NOTE — Progress Notes (Signed)
Mrs Kaitlin Ward denies chest pain or shortess of breath.  Patient has Covid test this am and has been in quarantine since that time. Mrs Kaitlin Ward has supected Anticardiolipin Antibiotics, she was taken Levonox, last dose was 0700 on 08/11/2019.

## 2019-08-12 NOTE — Anesthesia Preprocedure Evaluation (Addendum)
Anesthesia Evaluation  Patient identified by MRN, date of birth, ID band Patient awake    Reviewed: Allergy & Precautions, NPO status , Patient's Chart, lab work & pertinent test results  Airway Mallampati: II  TM Distance: >3 FB Neck ROM: Full    Dental no notable dental hx. (+) Teeth Intact   Pulmonary asthma ,    Pulmonary exam normal breath sounds clear to auscultation       Cardiovascular negative cardio ROS Normal cardiovascular exam Rhythm:Regular Rate:Normal     Neuro/Psych PSYCHIATRIC DISORDERS Anxiety Depression    GI/Hepatic Neg liver ROS, GERD  ,  Endo/Other    Renal/GU negative Renal ROS  negative genitourinary   Musculoskeletal negative musculoskeletal ROS (+)   Abdominal   Peds  Hematology Antiphospholipid Ab syndrome   Anesthesia Other Findings   Reproductive/Obstetrics (+) Pregnancy Missed Ab                                                             Anesthesia Evaluation  Patient identified by MRN, date of birth, ID band Patient awake    Reviewed: Allergy & Precautions, Patient's Chart, lab work & pertinent test results  Airway Mallampati: II  TM Distance: >3 FB Neck ROM: Full    Dental no notable dental hx. (+) Teeth Intact   Pulmonary neg pulmonary ROS,    Pulmonary exam normal breath sounds clear to auscultation       Cardiovascular negative cardio ROS Normal cardiovascular exam Rhythm:Regular Rate:Normal     Neuro/Psych negative neurological ROS  negative psych ROS   GI/Hepatic Neg liver ROS, GERD  Medicated and Controlled,  Endo/Other  negative endocrine ROS  Renal/GU negative Renal ROS  negative genitourinary   Musculoskeletal negative musculoskeletal ROS (+)   Abdominal   Peds  Hematology negative hematology ROS (+)   Anesthesia Other Findings   Reproductive/Obstetrics (+) Pregnancy HPV                              Anesthesia Physical Anesthesia Plan  ASA: II and emergent  Anesthesia Plan: Epidural   Post-op Pain Management:    Induction:   PONV Risk Score and Plan: 2 and Ondansetron, Dexamethasone, Scopolamine patch - Pre-op, Metaclopromide and Treatment may vary due to age or medical condition  Airway Management Planned: Natural Airway  Additional Equipment:   Intra-op Plan:   Post-operative Plan:   Informed Consent: I have reviewed the patients History and Physical, chart, labs and discussed the procedure including the risks, benefits and alternatives for the proposed anesthesia with the patient or authorized representative who has indicated his/her understanding and acceptance.     Plan Discussed with: Anesthesiologist, Surgeon and CRNA  Anesthesia Plan Comments: (Patient for C/section for arrest of descent. Will use epidural for C/section.)       Anesthesia Quick Evaluation  Anesthesia Physical Anesthesia Plan  ASA: II  Anesthesia Plan: MAC   Post-op Pain Management:    Induction: Intravenous  PONV Risk Score and Plan: 2 and Propofol infusion, Ondansetron, Midazolam and Treatment may vary due to age or medical condition  Airway Management Planned: Natural Airway, Nasal Cannula and Simple Face Mask  Additional Equipment:   Intra-op Plan:   Post-operative Plan:   Informed Consent:  I have reviewed the patients History and Physical, chart, labs and discussed the procedure including the risks, benefits and alternatives for the proposed anesthesia with the patient or authorized representative who has indicated his/her understanding and acceptance.     Dental advisory given  Plan Discussed with: CRNA and Surgeon  Anesthesia Plan Comments:        Anesthesia Quick Evaluation

## 2019-08-13 ENCOUNTER — Ambulatory Visit (HOSPITAL_COMMUNITY): Admitting: Anesthesiology

## 2019-08-13 ENCOUNTER — Encounter (HOSPITAL_COMMUNITY): Payer: Self-pay | Admitting: Obstetrics and Gynecology

## 2019-08-13 ENCOUNTER — Encounter (HOSPITAL_COMMUNITY)
Admission: RE | Disposition: A | Discharge status: Home / Self Care | Payer: Self-pay | Source: Home / Self Care | Attending: Obstetrics and Gynecology

## 2019-08-13 ENCOUNTER — Ambulatory Visit (HOSPITAL_COMMUNITY)
Admission: RE | Admit: 2019-08-13 | Discharge: 2019-08-13 | Disposition: A | Discharge status: Home / Self Care | Attending: Obstetrics and Gynecology | Admitting: Obstetrics and Gynecology

## 2019-08-13 ENCOUNTER — Other Ambulatory Visit: Payer: Self-pay

## 2019-08-13 DIAGNOSIS — O021 Missed abortion: Secondary | ICD-10-CM | POA: Insufficient documentation

## 2019-08-13 HISTORY — DX: Antiphospholipid syndrome: D68.61

## 2019-08-13 HISTORY — DX: Unspecified asthma, uncomplicated: J45.909

## 2019-08-13 HISTORY — PX: DILATION AND EVACUATION: SHX1459

## 2019-08-13 HISTORY — DX: Anxiety disorder, unspecified: F41.9

## 2019-08-13 HISTORY — DX: Depression, unspecified: F32.A

## 2019-08-13 LAB — CBC
HCT: 39.5 % (ref 36.0–46.0)
Hemoglobin: 12.9 g/dL (ref 12.0–15.0)
MCH: 31.8 pg (ref 26.0–34.0)
MCHC: 32.7 g/dL (ref 30.0–36.0)
MCV: 97.3 fL (ref 80.0–100.0)
Platelets: 195 10*3/uL (ref 150–400)
RBC: 4.06 MIL/uL (ref 3.87–5.11)
RDW: 12.5 % (ref 11.5–15.5)
WBC: 5.1 10*3/uL (ref 4.0–10.5)
nRBC: 0 % (ref 0.0–0.2)

## 2019-08-13 LAB — TYPE AND SCREEN
ABO/RH(D): A POS
Antibody Screen: NEGATIVE

## 2019-08-13 SURGERY — DILATION AND EVACUATION, UTERUS
Anesthesia: Monitor Anesthesia Care | Site: Vagina

## 2019-08-13 MED ORDER — ONDANSETRON HCL 4 MG/2ML IJ SOLN: 4.0000 mg | Freq: Once | INTRAMUSCULAR | Status: DC | PRN

## 2019-08-13 MED ORDER — ONDANSETRON HCL 4 MG/2ML IJ SOLN
INTRAMUSCULAR | Status: DC | PRN
  Administered 2019-08-13: 4 mg via INTRAVENOUS

## 2019-08-13 MED ORDER — CHLORHEXIDINE GLUCONATE 0.12 % MT SOLN
15.0000 mL | Freq: Once | OROMUCOSAL | Status: AC
  Administered 2019-08-13: 15 mL via OROMUCOSAL
  Filled 2019-08-13: qty 15

## 2019-08-13 MED ORDER — OXYCODONE HCL 5 MG PO TABS
5.0000 mg | ORAL_TABLET | Freq: Once | ORAL | Status: AC | PRN
  Administered 2019-08-13: 5 mg via ORAL

## 2019-08-13 MED ORDER — FENTANYL CITRATE (PF) 250 MCG/5ML IJ SOLN
INTRAMUSCULAR | Status: DC | PRN
  Administered 2019-08-13 (×2): 25 ug via INTRAVENOUS

## 2019-08-13 MED ORDER — LACTATED RINGERS IV SOLN: INTRAVENOUS | Status: DC

## 2019-08-13 MED ORDER — KETOROLAC TROMETHAMINE 30 MG/ML IJ SOLN
INTRAMUSCULAR | Status: DC | PRN
  Administered 2019-08-13: 30 mg via INTRAVENOUS

## 2019-08-13 MED ORDER — CHLOROPROCAINE HCL 1 % IJ SOLN
INTRAMUSCULAR | Status: AC
  Filled 2019-08-13: qty 30

## 2019-08-13 MED ORDER — KETOROLAC TROMETHAMINE 30 MG/ML IJ SOLN
INTRAMUSCULAR | Status: AC
  Filled 2019-08-13: qty 1

## 2019-08-13 MED ORDER — POVIDONE-IODINE 10 % EX SWAB
2.0000 "application " | Freq: Once | CUTANEOUS | Status: AC
  Administered 2019-08-13: 2 via TOPICAL

## 2019-08-13 MED ORDER — CHLOROPROCAINE HCL 1 % IJ SOLN
INTRAMUSCULAR | Status: DC | PRN
  Administered 2019-08-13: 10 mL

## 2019-08-13 MED ORDER — FENTANYL CITRATE (PF) 250 MCG/5ML IJ SOLN
INTRAMUSCULAR | Status: AC
  Filled 2019-08-13: qty 5

## 2019-08-13 MED ORDER — ORAL CARE MOUTH RINSE: 15.0000 mL | Freq: Once | OROMUCOSAL | Status: AC

## 2019-08-13 MED ORDER — OXYCODONE HCL 5 MG/5ML PO SOLN: 5.0000 mg | Freq: Once | ORAL | Status: AC | PRN

## 2019-08-13 MED ORDER — PROPOFOL 10 MG/ML IV BOLUS
INTRAVENOUS | Status: AC
  Filled 2019-08-13: qty 20

## 2019-08-13 MED ORDER — PROPOFOL 500 MG/50ML IV EMUL
INTRAVENOUS | Status: DC | PRN
  Administered 2019-08-13: 200 ug/kg/min via INTRAVENOUS

## 2019-08-13 MED ORDER — OXYCODONE HCL 5 MG PO TABS
ORAL_TABLET | ORAL | Status: AC
  Filled 2019-08-13: qty 1

## 2019-08-13 MED ORDER — MIDAZOLAM HCL 2 MG/2ML IJ SOLN
INTRAMUSCULAR | Status: AC
  Filled 2019-08-13: qty 2

## 2019-08-13 MED ORDER — MIDAZOLAM HCL 2 MG/2ML IJ SOLN
INTRAMUSCULAR | Status: DC | PRN
Start: 2019-08-13 — End: 2019-08-13
  Administered 2019-08-13: 2 mg via INTRAVENOUS

## 2019-08-13 MED ORDER — FENTANYL CITRATE (PF) 100 MCG/2ML IJ SOLN: 25.0000 ug | INTRAMUSCULAR | Status: DC | PRN

## 2019-08-13 MED ORDER — ONDANSETRON HCL 4 MG/2ML IJ SOLN
INTRAMUSCULAR | Status: AC
  Filled 2019-08-13: qty 2

## 2019-08-13 SURGICAL SUPPLY — 22 items
CATH ROBINSON RED A/P 16FR (CATHETERS) ×3 IMPLANT
DECANTER SPIKE VIAL GLASS SM (MISCELLANEOUS) ×3 IMPLANT
FILTER UTR ASPR ASSEMBLY (MISCELLANEOUS) ×3 IMPLANT
GLOVE BIO SURGEON STRL SZ 6.5 (GLOVE) ×2 IMPLANT
GLOVE BIO SURGEONS STRL SZ 6.5 (GLOVE) ×1
GLOVE BIOGEL PI IND STRL 7.0 (GLOVE) ×2 IMPLANT
GLOVE BIOGEL PI INDICATOR 7.0 (GLOVE) ×4
GOWN STRL REUS W/ TWL LRG LVL3 (GOWN DISPOSABLE) ×2 IMPLANT
GOWN STRL REUS W/TWL LRG LVL3 (GOWN DISPOSABLE) ×4
HOSE CONNECTING 18IN BERKELEY (TUBING) ×3 IMPLANT
KIT BERKELEY 1ST TRI 3/8 NO TR (MISCELLANEOUS) ×3 IMPLANT
KIT BERKELEY 1ST TRIMESTER 3/8 (MISCELLANEOUS) ×3 IMPLANT
NS IRRIG 1000ML POUR BTL (IV SOLUTION) ×3 IMPLANT
PACK VAGINAL MINOR WOMEN LF (CUSTOM PROCEDURE TRAY) ×3 IMPLANT
PAD OB MATERNITY 4.3X12.25 (PERSONAL CARE ITEMS) ×3 IMPLANT
SET BERKELEY SUCTION TUBING (SUCTIONS) ×3 IMPLANT
TOWEL GREEN STERILE FF (TOWEL DISPOSABLE) ×6 IMPLANT
UNDERPAD 30X36 HEAVY ABSORB (UNDERPADS AND DIAPERS) ×3 IMPLANT
VACURETTE 10 RIGID CVD (CANNULA) IMPLANT
VACURETTE 7MM CVD STRL WRAP (CANNULA) IMPLANT
VACURETTE 8 RIGID CVD (CANNULA) ×3 IMPLANT
VACURETTE 9 RIGID CVD (CANNULA) IMPLANT

## 2019-08-13 NOTE — Transfer of Care (Signed)
Immediate Anesthesia Transfer of Care Note  Patient: Kaitlin Ward  Procedure(s) Performed: DILATATION AND EVACUATION (N/A Vagina ) CHROMOSOME STUDIES (N/A Vagina )  Patient Location: PACU  Anesthesia Type:MAC  Level of Consciousness: awake, alert  and oriented  Airway & Oxygen Therapy: Patient Spontanous Breathing  Post-op Assessment: Report given to RN, Post -op Vital signs reviewed and stable and Patient moving all extremities  Post vital signs: Reviewed and stable  Last Vitals:  Vitals Value Taken Time  BP 121/77 08/13/19 0831  Temp 36.7 C 08/13/19 0800  Pulse 59 08/13/19 0834  Resp 15 08/13/19 0834  SpO2 100 % 08/13/19 0834  Vitals shown include unvalidated device data.  Last Pain:  Vitals:   08/13/19 0800  TempSrc:   PainSc: 0-No pain      Patients Stated Pain Goal: 4 (16/10/96 0454)  Complications: No complications documented.

## 2019-08-13 NOTE — Addendum Note (Signed)
Addendum  created 08/13/19 1413 by Colon Flattery, CRNA   Intraprocedure Event edited

## 2019-08-13 NOTE — Anesthesia Postprocedure Evaluation (Signed)
Anesthesia Post Note  Patient: Kaitlin Ward  Procedure(s) Performed: DILATATION AND EVACUATION (N/A Vagina ) CHROMOSOME STUDIES (N/A Vagina )     Patient location during evaluation: PACU Anesthesia Type: MAC Level of consciousness: awake and alert and oriented Pain management: pain level controlled Vital Signs Assessment: post-procedure vital signs reviewed and stable Respiratory status: spontaneous breathing, nonlabored ventilation and respiratory function stable Cardiovascular status: stable and blood pressure returned to baseline Postop Assessment: no apparent nausea or vomiting Anesthetic complications: no   No complications documented.  Last Vitals:  Vitals:   08/13/19 0815 08/13/19 0830  BP: 112/73 121/77  Pulse: (!) 55 (!) 59  Resp: 17 15  Temp:    SpO2: 100% 100%    Last Pain:  Vitals:   08/13/19 0800  TempSrc:   PainSc: 0-No pain                 Gerald Kuehl A.

## 2019-08-13 NOTE — Anesthesia Procedure Notes (Signed)
Procedure Name: MAC Date/Time: 08/13/2019 7:34 AM Performed by: Amadeo Garnet, CRNA Pre-anesthesia Checklist: Patient identified, Emergency Drugs available, Suction available and Patient being monitored Patient Re-evaluated:Patient Re-evaluated prior to induction Oxygen Delivery Method: Circle system utilized Preoxygenation: Pre-oxygenation with 100% oxygen Induction Type: IV induction Ventilation: Mask ventilation without difficulty Placement Confirmation: positive ETCO2 Dental Injury: Teeth and Oropharynx as per pre-operative assessment

## 2019-08-13 NOTE — Op Note (Signed)
PREOPERATIVE DIAGNOSES: 1. Missed Abortion  POSTOPERATIVE DIAGNOSES: Same  PROCEDURE PERFORMED: Dilation, suction, sharp curretage  SURGEON: Dr. Belva Agee  ANESTHESIA: Paracervical block and IV sedation  ESTIMATED BLOOD LOSS: 25cc.  COMPLICATIONS: None  TUBES: None.  DRAINS: None  PATHOLOGY: Endometrial curettings for chromosomal analysis with anora  FINDINGS: On exam, under anesthesia, normal appearing vulva and vagina, 8 week sized uterus  Operative findings demonstrated plethora of POCs  Procedure: The patient was taken to the operating room where she was properly prepped and draped in sterile manner under general anesthesia. After bimanual examination, the cervix was exposed with a speculum and the anterior lip of the cervix grasped with a tenaculum. Paracervical block performed. The endocervical canal was then progressively dilated to 52mm. Suction catheter was introduced into the uterus and to the uterine fundus. The uterus was evacuated and good tissue return was noted. A sharp curettage was then performed until gritty texture noted. All instruments were removed from vagina. The sponge and lap counts were correct times 2 at this time. The patient's procedure was terminated. We then awakened her. She was sent to the Recovery Room in good condition.    Belva Agee MD

## 2019-08-13 NOTE — Progress Notes (Signed)
No updates to above H&P. Patient arrived NPO and was consented in PACU. Risks again discussed, all questions answered, and consent signed. Proceed with above surgery.    Damian Hofstra MD  

## 2019-08-14 ENCOUNTER — Encounter (HOSPITAL_COMMUNITY): Payer: Self-pay | Admitting: Obstetrics and Gynecology

## 2019-08-16 LAB — SURGICAL PATHOLOGY

## 2019-10-18 ENCOUNTER — Inpatient Hospital Stay: Attending: Hematology and Oncology | Admitting: Hematology and Oncology

## 2019-10-18 ENCOUNTER — Other Ambulatory Visit: Payer: Self-pay | Admitting: *Deleted

## 2019-10-18 ENCOUNTER — Other Ambulatory Visit: Payer: Self-pay

## 2019-10-18 ENCOUNTER — Encounter: Payer: Self-pay | Admitting: Hematology and Oncology

## 2019-10-18 ENCOUNTER — Inpatient Hospital Stay

## 2019-10-18 VITALS — BP 121/83 | HR 65 | Temp 97.2°F | Resp 18 | Ht 65.0 in | Wt 127.2 lb

## 2019-10-18 DIAGNOSIS — N96 Recurrent pregnancy loss: Secondary | ICD-10-CM

## 2019-10-18 DIAGNOSIS — R760 Raised antibody titer: Secondary | ICD-10-CM | POA: Diagnosis not present

## 2019-10-18 DIAGNOSIS — Z7982 Long term (current) use of aspirin: Secondary | ICD-10-CM | POA: Insufficient documentation

## 2019-10-18 LAB — CBC WITH DIFFERENTIAL (CANCER CENTER ONLY)
Abs Immature Granulocytes: 0.01 10*3/uL (ref 0.00–0.07)
Basophils Absolute: 0 10*3/uL (ref 0.0–0.1)
Basophils Relative: 1 %
Eosinophils Absolute: 0.1 10*3/uL (ref 0.0–0.5)
Eosinophils Relative: 1 %
HCT: 38.9 % (ref 36.0–46.0)
Hemoglobin: 12.9 g/dL (ref 12.0–15.0)
Immature Granulocytes: 0 %
Lymphocytes Relative: 50 %
Lymphs Abs: 2.1 10*3/uL (ref 0.7–4.0)
MCH: 31.8 pg (ref 26.0–34.0)
MCHC: 33.2 g/dL (ref 30.0–36.0)
MCV: 95.8 fL (ref 80.0–100.0)
Monocytes Absolute: 0.3 10*3/uL (ref 0.1–1.0)
Monocytes Relative: 7 %
Neutro Abs: 1.7 10*3/uL (ref 1.7–7.7)
Neutrophils Relative %: 41 %
Platelet Count: 198 10*3/uL (ref 150–400)
RBC: 4.06 MIL/uL (ref 3.87–5.11)
RDW: 12.4 % (ref 11.5–15.5)
WBC Count: 4.2 10*3/uL (ref 4.0–10.5)
nRBC: 0 % (ref 0.0–0.2)

## 2019-10-18 LAB — CMP (CANCER CENTER ONLY)
ALT: 20 U/L (ref 0–44)
AST: 20 U/L (ref 15–41)
Albumin: 4.1 g/dL (ref 3.5–5.0)
Alkaline Phosphatase: 41 U/L (ref 38–126)
Anion gap: 6 (ref 5–15)
BUN: 11 mg/dL (ref 6–20)
CO2: 29 mmol/L (ref 22–32)
Calcium: 9.5 mg/dL (ref 8.9–10.3)
Chloride: 104 mmol/L (ref 98–111)
Creatinine: 0.82 mg/dL (ref 0.44–1.00)
GFR, Est AFR Am: >60 mL/min (ref >60)
GFR, Estimated: >60 mL/min (ref >60)
Glucose, Bld: 121 mg/dL — ABNORMAL HIGH (ref 70–99)
Potassium: 4.2 mmol/L (ref 3.5–5.1)
Sodium: 139 mmol/L (ref 135–145)
Total Bilirubin: 0.5 mg/dL (ref 0.3–1.2)
Total Protein: 7.4 g/dL (ref 6.5–8.1)

## 2019-10-18 NOTE — Progress Notes (Signed)
[facility] [phone]   [phone]  PROGRESS NOTE  Patient Care Team: Patient, No Pcp Per as PCP - General (General Practice)  Hematological/Oncological History # Elevated Cardiolipin IgM/ Recurrent Pregnancy Loss 1) 04/06/2019: Anticardiolipin IgM 19 (Indeterminate)  2) 04/27/2019: Anticardiolipin IgM 23 (Low-Med Positive) 3) 05/24/2019: establish care with Dr. Lorenso Courier. Patient started ASA 22m PO daily.   4) 07/02/2019: patient found she was pregnant. Started on lovenox 447msubq daily.  5) 07/20/2019: Scheduled for 6 week ultrasound.  6) 08/13/2019:  dilation and evacuation due to a missed abortion  Interval History:  Kaitlin Graver89.o. female with medical history significant for 3 miscarriages (in a one year period) who presents for a follow up visit. The patient's last visit was on 07/19/2019 when she was approximately [redacted] weeks pregnant. In the interim since the last visit the patient underwent dilation and evacuation on 08/13/2019 due to a missed abortion.   On exam today Kaitlin Ward she has been well since her last visit.  She has not been pregnant again in the interim.  She notes that she has numerous visits lined up with additional specialist.  She notes that she is scheduled for a saline hysterography and has a visit later today with a reproductive immunologist.  She reports that she is considering IVF and is considering having this performed during a mediated cycle in October 2021.  She notes that the bruises she had from the Lovenox shots have subsequently healed well and she has had no further issues with bleeding, bruising, or dark stools.  She is currently taking numerous supplements including immunoglobulins p.o. in order to help her immunity and preservation of her legs.  She currently denies having any issues with fevers, chills, sweats, nausea, vomiting or diarrhea.  A full 10 point ROS is listed below.  MEDICAL HISTORY:  Past Medical  History:  Diagnosis Date  . Anxiety   . APS (antiphospholipid syndrome) (HCC)    suspected  . Asthma    exercise induce  . Depression   . Endometriosis   . GERD (gastroesophageal reflux disease)    with pregancy  . HPV (human papilloma virus) anogenital infection   . Vaginal Pap smear, abnormal     SURGICAL HISTORY: Past Surgical History:  Procedure Laterality Date  . CESAREAN SECTION N/A 08/27/2016   Procedure: CESAREAN SECTION;  Surgeon: FoAloha GellMD;  [facility]: [facility];  Laterality: N/A;  . COLPOSCOPY    . DILATION AND EVACUATION  02/2019  . DILATION AND EVACUATION N/A 08/13/2019   Procedure: DILATATION AND EVACUATION;  Surgeon: LeTyson DenseMD;  [facility];  Laterality: N/A;  DR. LEGER IS ON CALL  . WISDOM TOOTH EXTRACTION      SOCIAL HISTORY: Social History   Socioeconomic History  . Marital status: Married    Spouse name: Not on file  . Number of children: 1  . Years of education: Not on file  . Highest education level: Not on file  Occupational History  . Not on file  Tobacco Use  . Smoking status: Never Smoker  . Smokeless tobacco: Never Used  Vaping Use  . Vaping Use: Never used  Substance and Sexual Activity  . Alcohol use: Yes    Comment: not when pregnant  . Drug use: Not on file  . Sexual activity: Not on file  Other Topics Concern  . Not on file  Social History Narrative  .  Not on file   Social Determinants of Health   Financial Resource Strain:   . Difficulty of Paying Living Expenses: Not on file  Food Insecurity:   . Worried About Charity fundraiser in the Last Year: Not on file  . Ran Out of Food in the Last Year: Not on file  Transportation Needs:   . Lack of Transportation (Medical): Not on file  . Lack of Transportation (Non-Medical): Not on file  Physical Activity:   . Days of Exercise per Week: Not on file  . Minutes of Exercise per Session: Not on file    Stress:   . Feeling of Stress : Not on file  Social Connections:   . Frequency of Communication with Friends and Family: Not on file  . Frequency of Social Gatherings with Friends and Family: Not on file  . Attends Religious Services: Not on file  . Active Member of Clubs or Organizations: Not on file  . Attends Archivist Meetings: Not on file  . Marital Status: Not on file  Intimate Partner Violence:   . Fear of Current or Ex-Partner: Not on file  . Emotionally Abused: Not on file  . Physically Abused: Not on file  . Sexually Abused: Not on file    FAMILY HISTORY: Family History  Problem Relation Age of Onset  . Vascular Disease Father   . Hypertension Maternal Grandmother   . Hypertension Paternal Grandmother   . Rheum arthritis Paternal Grandmother     ALLERGIES:  has No Known Allergies.  MEDICATIONS:  Current Outpatient Medications  Medication Sig Dispense Refill  . aspirin EC 81 MG tablet Take 81 mg by mouth daily. Swallow whole.    . Omega-3 Fatty Acids (SUPER OMEGA 3 EPA/DHA) 1000 MG CAPS     . Vitamin E 180 MG (400 UNIT) CAPS     . albuterol (VENTOLIN HFA) 108 (90 Base) MCG/ACT inhaler Inhale 2 puffs into the lungs every 6 (six) hours as needed for wheezing or shortness of breath.     . Ascorbic Acid (VITAMIN C) 1000 MG tablet Take 1,000 mg by mouth daily.    . Cholecalciferol (VITAMIN D) 50 MCG (2000 UT) tablet Take 2,000 Units by mouth daily.    . Coenzyme Q10 (COQ-10) 100 MG CAPS Take 100 mg by mouth daily.    . folic acid (FOLVITE) 1 MG tablet Take 1 mg by mouth daily.    . Melatonin 5 MG CAPS Take 5 mg by mouth at bedtime.    . Prenatal Vit-Fe Fumarate-FA (PRENATAL MULTIVITAMIN) TABS tablet Take 1 tablet by mouth daily.     Marland Kitchen zinc gluconate 50 MG tablet Take 50 mg by mouth daily.     No current facility-administered medications for this visit.    REVIEW OF SYSTEMS:   Constitutional: ( - ) fevers, ( - )  chills , ( - ) night sweats Eyes: ( - )  blurriness of vision, ( - ) double vision, ( - ) watery eyes Ears, nose, mouth, throat, and face: ( - ) mucositis, ( - ) sore throat Respiratory: ( - ) cough, ( - ) dyspnea, ( - ) wheezes Cardiovascular: ( - ) palpitation, ( - ) chest discomfort, ( - ) lower extremity swelling Gastrointestinal:  ( - ) nausea, ( - ) heartburn, ( - ) change in bowel habits Skin: ( - ) abnormal skin rashes Lymphatics: ( - ) new lymphadenopathy, ( - ) easy bruising Neurological: ( - )  numbness, ( - ) tingling, ( - ) new weaknesses Behavioral/Psych: ( - ) mood change, ( - ) new changes  All other systems were reviewed with the patient and are negative.  PHYSICAL EXAMINATION: ECOG PERFORMANCE STATUS: 0 - Asymptomatic  Vitals:   10/18/19 1055  BP: 121/83  Pulse: 65  Resp: 18  Temp: (!) 97.2 F (36.2 C)  SpO2: 100%   Filed Weights   10/18/19 1055  Weight: 127 lb 3.2 oz (57.7 kg)    GENERAL: well appearing middle aged Caucasian female. alert, no distress and comfortable SKIN: marked bruising on the abdomen in setting of lovenox shots. Otheriwise skin color, texture, turgor are normal, no rashes or significant lesions EYES: conjunctiva are pink and non-injected, sclera clearl LUNGS: clear to auscultation and percussion with normal breathing effort HEART: regular rate & rhythm and no murmurs and no lower extremity edema Musculoskeletal: no cyanosis of digits and no clubbing  PSYCH: alert & oriented x 3, fluent speech NEURO: no focal motor/sensory deficits  LABORATORY DATA:  I have reviewed the data as listed CBC Latest Ref Rng & Units 10/18/2019 08/13/2019 07/19/2019  WBC 4.0 - 10.5 K/uL 4.2 5.1 5.6  Hemoglobin 12.0 - 15.0 g/dL 12.9 12.9 12.1  Hematocrit 36 - 46 % 38.9 39.5 35.7(L)  Platelets 150 - 400 K/uL 198 195 217    CMP Latest Ref Rng & Units 10/18/2019 07/19/2019 05/24/2019  Glucose 70 - 99 mg/dL 121(H) 88 129(H)  BUN 6 - 20 mg/dL '11 12 12  ' Creatinine 0.44 - 1.00 mg/dL 0.82 0.78 0.81  Sodium  135 - 145 mmol/L 139 139 138  Potassium 3.5 - 5.1 mmol/L 4.2 4.0 3.8  Chloride 98 - 111 mmol/L 104 105 104  CO2 22 - 32 mmol/L '29 24 26  ' Calcium 8.9 - 10.3 mg/dL 9.5 9.6 9.0  Total Protein 6.5 - 8.1 g/dL 7.4 7.3 7.6  Total Bilirubin 0.3 - 1.2 mg/dL 0.5 0.5 0.3  Alkaline Phos 38 - 126 U/L 41 39 56  AST 15 - 41 U/L '20 18 26  ' ALT 0 - 44 U/L 20 24 32    RADIOGRAPHIC STUDIES: No results found.  ASSESSMENT & PLAN Shaolin Armas 34 y.o. 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  After review of the previous labs and discussion with the patient her findings are consistent with recurrent pregnancy loss prior to the 10th week.  She has had 3 episodes of this and the etiology at this time is not clear.  As part of her evaluation she was found to have a intermediate level anticardiolipin IgM as well as a low medium positive anticardiolipin IgM of 23 about 3 weeks later.  At this time the patient does not meet the diagnostic criteria of antiphospholipid antibody syndrome.  She has no history of VTE and no clear prior episodes of pregnancy loss.  Although the patient does not meet the clinical criteria for APS I was agreeable to anticoagulation therapy and preventative aspirin as she became pregnant between our initial visit and the confirmatory test planned for today.   Listing of the hypercoagulation testing is mentioned below as well as the full diagnostic criteria for antiphospholipid antibody syndrome in the literature support section.  She is considering using Lovenox and aspirin therapy if she were to become pregnant again, though we have been clear in our discussions that she does not currently meet the criteria for  antiphospholipid antibody syndrome and it is unclear if this is providing any benefit.  She voiced her understanding of these findings and is open to continued  discussion.  Hypercoagulation Testing 04/06/2019 Anticardiolipin IgM 19 (Indeterminate)   Anticardiolipin IgG <9  Factor V Leiden: negative Beta 2 Glycoprotein IgG <9  and IgM 16 (nml 0-32)    Anticardiolipin IgM 23 (Low-Med Positive) 04/27/2019  Repeat Testing 07/19/2019 Anticardiolipin IgG:  16  (Indeterminate) Anticardiolipin IgM: 20  (Low-Med Positive)   # Elevated Cardiolipin IgM in the Setting of 3 miscarriages --patient had indeterminate testing on 04/06/2019 followed by low positive on 04/27/2019. Beta 2 glycoprotein WNL, no lupus anticoagulant panel --confirmation of the diagnosis of APS requires meeting clinical and laboratory criteria based on the [place] criteria. The laboratory criteria is for 2 positive antibody tests separated by 3 months. Clinical criteria includes recurrent miscarriage x 3 before the 10 week mark. The patient does not currently laboratory criteria, though she does meet clinic criteria. --continue recurrent miscarriage workup with Ob/Gyn and other specialists --after a discussion regarding the risks and benefits of treatment, we will consider the use of Lovenox (68m subq daily) and ASA (834mPO daily) as protective agents when she becomes pregnant (in the setting of APS in pregnancy with recurrent loss). These recommendations are supported by the literature.   --RTC as need if she has repeat pregnancy and desire to start anticoagulation treatment.   No orders of the defined types were placed in this encounter.  All questions were answered. The patient knows to call the clinic with any problems, questions or concerns.  A total of more than 25 minutes were spent on this encounter and over half of that time was spent on counseling and coordination of care as outlined above.   JoLedell PeoplesMD Department of Hematology/Oncology [facility] [place]: [phone]: [phone]:  [email or web address]  10/18/2019 11:38 AM   Literature Support:  MiConni SlipperD, AtIleana RoupBranch DW, Brey RL, CeSouth SarasotaDerksen RH, DE GrJamestownKoYellow PineMeroni PL, Reber G, ShOakland AcresTincani A, Vlachoyiannopoulos PG, Krilis SASouth CarolinaInternational consensus statement on an update of the classification criteria for definite antiphospholipid syndrome (APS). J Thromb Haemost. 2006 Feb;4(2):295-306.  --Antiphospholipid antibody syndrome (APS) is present if at least one of the clinical criteria and one of the laboratory criteria that follow are met* Clinical criteria 1. Vascular thrombosis One or more clinical episodes of arterial, venous, or small vessel thrombosis, in any tissue or organ. Thrombosis must be confirmed by objective validated criteria (i.e. unequivocal findings of appropriate imaging studies or histopathology). For histopathologic confirmation, thrombosis should be present without significant evidence of inflammation in the vessel wall. 2. Pregnancy morbidity ?(a) One or more unexplained deaths of a morphologically normal fetus at or beyond the 10th week of gestation, with normal fetal morphology documented by ultrasound or by direct examination of the fetus, or ?(b) One or more premature births of a morphologically normal neonate before the 34th week of gestation because of: (i) eclampsia or severe pre?eclampsia defined according to standard definitions, or (ii) recognized features of placental insufficiency or ?(c) Three or more unexplained consecutive spontaneous abortions before the 10th week of gestation, with maternal anatomic or hormonal abnormalities and paternal and maternal chromosomal causes excluded. In studies of populations of patients who have more than one type of pregnancy morbidity, investigators are strongly encouraged to stratify groups of subjects according to a, b, or c above.  Laboratory criteria 1. Lupus anticoagulant (LA) present in plasma, on  two or more occasions at least 12?weeks apart, detected according to the guidelines of the International Society on Thrombosis and Haemostasis (Scientific Subcommittee on LAs/phospholipid?dependent antibodies) . 2. Anticardiolipin (aCL) antibody of IgG and/or IgM isotype in serum or plasma, present in medium or high titer (i.e. >40 GPL or MPL, or >the 99th percentile), on two or more occasions, at least 12?weeks apart, measured by a standardized ELISA . 3. Anti??2 glycoprotein?I antibody of IgG and/or IgM isotype in serum or plasma (in titer >the 99th percentile), present on two or more occasions, at least 12?weeks apart, measured by a standardized ELISA, according to recommended procedures.  Keturah Shavers, Regan L. Randomised controlled trial of aspirin and aspirin plus heparin in pregnant women with recurrent miscarriage associated with phospholipid antibodies (or antiphospholipid antibodies). BMJ. 1997 Jan 25;314(7076):253-7.  --Treatment with aspirin and heparin leads to a significantly higher rate of live births in women with a history of recurrent miscarriage associated with phospholipid antibodies than that achieved with aspirin alone.

## 2019-10-21 ENCOUNTER — Encounter: Payer: Self-pay | Admitting: Hematology and Oncology

## 2020-01-12 ENCOUNTER — Encounter: Payer: Self-pay | Admitting: Hematology and Oncology

## 2020-01-13 ENCOUNTER — Telehealth: Payer: Self-pay | Admitting: Hematology and Oncology

## 2020-01-13 ENCOUNTER — Other Ambulatory Visit: Payer: Self-pay | Admitting: Hematology and Oncology

## 2020-01-13 DIAGNOSIS — R760 Raised antibody titer: Secondary | ICD-10-CM

## 2020-01-13 NOTE — Telephone Encounter (Signed)
Scheduled appt per 12/9 sch msg - pt aware of apt date and time

## 2020-01-20 ENCOUNTER — Inpatient Hospital Stay: Attending: Hematology and Oncology

## 2020-01-20 ENCOUNTER — Other Ambulatory Visit: Payer: Self-pay

## 2020-01-20 DIAGNOSIS — N96 Recurrent pregnancy loss: Secondary | ICD-10-CM | POA: Diagnosis not present

## 2020-01-20 DIAGNOSIS — R760 Raised antibody titer: Secondary | ICD-10-CM | POA: Insufficient documentation

## 2020-01-21 LAB — IGG, IGA, IGM
IgA: 170 mg/dL (ref 87–352)
IgG (Immunoglobin G), Serum: 907 mg/dL (ref 586–1602)
IgM (Immunoglobulin M), Srm: 319 mg/dL — ABNORMAL HIGH (ref 26–217)

## 2020-01-21 LAB — KAPPA/LAMBDA LIGHT CHAINS
Kappa free light chain: 13.3 mg/L (ref 3.3–19.4)
Kappa, lambda light chain ratio: 1.11 (ref 0.26–1.65)
Lambda free light chains: 12 mg/L (ref 5.7–26.3)

## 2020-01-22 LAB — MULTIPLE MYELOMA PANEL, SERUM
Albumin SerPl Elph-Mcnc: 3.9 g/dL (ref 2.9–4.4)
Albumin/Glob SerPl: 1.4 (ref 0.7–1.7)
Alpha 1: 0.2 g/dL (ref 0.0–0.4)
Alpha2 Glob SerPl Elph-Mcnc: 0.6 g/dL (ref 0.4–1.0)
B-Globulin SerPl Elph-Mcnc: 0.8 g/dL (ref 0.7–1.3)
Gamma Glob SerPl Elph-Mcnc: 1.2 g/dL (ref 0.4–1.8)
Globulin, Total: 2.9 g/dL (ref 2.2–3.9)
IgA: 174 mg/dL (ref 87–352)
IgG (Immunoglobin G), Serum: 924 mg/dL (ref 586–1602)
IgM (Immunoglobulin M), Srm: 303 mg/dL — ABNORMAL HIGH (ref 26–217)
Total Protein ELP: 6.8 g/dL (ref 6.0–8.5)

## 2020-01-24 ENCOUNTER — Telehealth: Payer: Self-pay | Admitting: *Deleted

## 2020-01-24 NOTE — Telephone Encounter (Signed)
TCT patient regarding recent lab results. Spoke with patient and advised that her SPEP did not reveal any concern for multiple myeloma or other blood disorders. Advised that she does have an elevated IgM which can be caused by an underlying inflammatory or autoimmune disorder. Per Dr. Lorenso Courier, he does not see a barrier to the IVIG therapy her reproductive immunologist has proposed.  Kaitlin Ward voiced understanding and asked that this information be sent to her via MyChart so she can share with her reproductive imunnologist

## 2020-01-24 NOTE — Telephone Encounter (Signed)
-----  Message from Orson Slick, MD sent at 01/24/2020 10:07 AM EST ----- Please let Mrs. Teat know that our SPEP did not show any findings concerning for multiple myeloma or another blood disorder. She does have elevated IgM which can be caused by an underlying inflammatory or autoimmune disorder. From our perspective there is no barrier to IVIG therapy from her reproductive immunologist.   ----- Message ----- From: Buel Ream, Lab In [place] Sent: 01/21/2020   7:37 AM EST To: Orson Slick, MD

## 2020-02-07 ENCOUNTER — Other Ambulatory Visit: Payer: Self-pay | Admitting: Obstetrics & Gynecology

## 2020-02-07 DIAGNOSIS — N96 Recurrent pregnancy loss: Secondary | ICD-10-CM

## 2020-02-09 ENCOUNTER — Ambulatory Visit
Admission: RE | Admit: 2020-02-09 | Discharge: 2020-02-09 | Disposition: A | Discharge status: Home / Self Care | Source: Ambulatory Visit | Attending: Obstetrics & Gynecology | Admitting: Obstetrics & Gynecology

## 2020-02-09 ENCOUNTER — Other Ambulatory Visit: Payer: Self-pay | Admitting: Obstetrics & Gynecology

## 2020-02-09 ENCOUNTER — Other Ambulatory Visit: Payer: Self-pay

## 2020-02-09 DIAGNOSIS — N96 Recurrent pregnancy loss: Secondary | ICD-10-CM | POA: Diagnosis present

## 2020-02-16 ENCOUNTER — Ambulatory Visit
Admission: RE | Admit: 2020-02-16 | Discharge: 2020-02-16 | Disposition: A | Discharge status: Home / Self Care | Source: Ambulatory Visit | Attending: Obstetrics & Gynecology | Admitting: Obstetrics & Gynecology

## 2020-02-16 ENCOUNTER — Other Ambulatory Visit: Payer: Self-pay

## 2020-02-16 ENCOUNTER — Ambulatory Visit

## 2020-02-16 DIAGNOSIS — N96 Recurrent pregnancy loss: Secondary | ICD-10-CM

## 2020-02-22 ENCOUNTER — Other Ambulatory Visit: Payer: Self-pay | Admitting: Obstetrics & Gynecology

## 2020-02-22 DIAGNOSIS — N96 Recurrent pregnancy loss: Secondary | ICD-10-CM

## 2020-02-24 ENCOUNTER — Other Ambulatory Visit: Payer: Self-pay

## 2020-02-24 ENCOUNTER — Telehealth: Payer: Self-pay | Admitting: Obstetrics & Gynecology

## 2020-02-24 ENCOUNTER — Ambulatory Visit
Admission: RE | Admit: 2020-02-24 | Discharge: 2020-02-24 | Disposition: A | Discharge status: Home / Self Care | Source: Ambulatory Visit | Attending: Obstetrics & Gynecology | Admitting: Obstetrics & Gynecology

## 2020-02-24 DIAGNOSIS — N96 Recurrent pregnancy loss: Secondary | ICD-10-CM | POA: Diagnosis not present

## 2020-02-24 NOTE — Telephone Encounter (Signed)
02/24/20~Per Kim/[facility] [place] Dept, Corrected order needed, Order should be US OB less than 14 wks w US OB Transvag, incl CPT [id number] & [id number], also to state, (weekly x 4) incl how many times pt to be schd. LVM for Candis Schatz @ [phone] ext [phone] with details. MF

## 2020-02-25 ENCOUNTER — Other Ambulatory Visit: Payer: Self-pay | Admitting: Obstetrics & Gynecology

## 2020-02-25 ENCOUNTER — Other Ambulatory Visit (HOSPITAL_COMMUNITY): Payer: Self-pay | Admitting: Obstetrics & Gynecology

## 2020-02-25 DIAGNOSIS — N96 Recurrent pregnancy loss: Secondary | ICD-10-CM

## 2020-03-08 ENCOUNTER — Ambulatory Visit
Admission: RE | Admit: 2020-03-08 | Discharge: 2020-03-08 | Disposition: A | Discharge status: Home / Self Care | Source: Ambulatory Visit | Attending: Obstetrics & Gynecology | Admitting: Obstetrics & Gynecology

## 2020-03-08 ENCOUNTER — Other Ambulatory Visit: Payer: Self-pay

## 2020-03-08 DIAGNOSIS — N96 Recurrent pregnancy loss: Secondary | ICD-10-CM | POA: Diagnosis not present

## 2020-03-09 ENCOUNTER — Other Ambulatory Visit: Payer: Self-pay | Admitting: Obstetrics & Gynecology

## 2020-03-09 DIAGNOSIS — N96 Recurrent pregnancy loss: Secondary | ICD-10-CM

## 2020-03-15 ENCOUNTER — Other Ambulatory Visit: Payer: Self-pay

## 2020-03-15 ENCOUNTER — Ambulatory Visit
Admission: RE | Admit: 2020-03-15 | Discharge: 2020-03-15 | Disposition: A | Discharge status: Home / Self Care | Source: Ambulatory Visit | Attending: Obstetrics & Gynecology | Admitting: Obstetrics & Gynecology

## 2020-03-15 ENCOUNTER — Other Ambulatory Visit: Payer: Self-pay | Admitting: Obstetrics & Gynecology

## 2020-03-15 DIAGNOSIS — N96 Recurrent pregnancy loss: Secondary | ICD-10-CM

## 2020-03-16 ENCOUNTER — Other Ambulatory Visit: Payer: Self-pay | Admitting: Obstetrics & Gynecology

## 2020-03-16 ENCOUNTER — Other Ambulatory Visit (HOSPITAL_COMMUNITY): Payer: Self-pay | Admitting: Obstetrics & Gynecology

## 2020-03-16 DIAGNOSIS — O3680X0 Pregnancy with inconclusive fetal viability, not applicable or unspecified: Secondary | ICD-10-CM

## 2020-03-21 LAB — OB RESULTS CONSOLE RPR: RPR: NONREACTIVE

## 2020-03-21 LAB — OB RESULTS CONSOLE RUBELLA ANTIBODY, IGM: Rubella: IMMUNE

## 2020-03-21 LAB — OB RESULTS CONSOLE ABO/RH: RH Type: POSITIVE

## 2020-03-21 LAB — OB RESULTS CONSOLE ANTIBODY SCREEN: Antibody Screen: NEGATIVE

## 2020-03-21 LAB — OB RESULTS CONSOLE HEPATITIS B SURFACE ANTIGEN: Hepatitis B Surface Ag: NEGATIVE

## 2020-03-23 ENCOUNTER — Other Ambulatory Visit: Payer: Self-pay | Admitting: Obstetrics & Gynecology

## 2020-03-23 ENCOUNTER — Other Ambulatory Visit: Payer: Self-pay

## 2020-03-23 ENCOUNTER — Ambulatory Visit
Admission: RE | Admit: 2020-03-23 | Discharge: 2020-03-23 | Disposition: A | Discharge status: Home / Self Care | Source: Ambulatory Visit | Attending: Obstetrics & Gynecology | Admitting: Obstetrics & Gynecology

## 2020-03-23 DIAGNOSIS — O3680X0 Pregnancy with inconclusive fetal viability, not applicable or unspecified: Secondary | ICD-10-CM

## 2020-03-30 ENCOUNTER — Other Ambulatory Visit: Payer: Self-pay | Admitting: Obstetrics and Gynecology

## 2020-03-30 DIAGNOSIS — Z363 Encounter for antenatal screening for malformations: Secondary | ICD-10-CM

## 2020-04-03 ENCOUNTER — Encounter: Payer: Self-pay | Admitting: *Deleted

## 2020-04-04 ENCOUNTER — Other Ambulatory Visit: Payer: Self-pay | Admitting: Obstetrics and Gynecology

## 2020-04-04 DIAGNOSIS — Z363 Encounter for antenatal screening for malformations: Secondary | ICD-10-CM

## 2020-04-06 ENCOUNTER — Encounter: Payer: Self-pay | Admitting: *Deleted

## 2020-04-06 ENCOUNTER — Encounter

## 2020-04-06 ENCOUNTER — Ambulatory Visit: Admitting: *Deleted

## 2020-04-06 ENCOUNTER — Other Ambulatory Visit: Payer: Self-pay

## 2020-04-06 ENCOUNTER — Ambulatory Visit: Attending: Obstetrics and Gynecology

## 2020-04-06 VITALS — BP 126/89 | HR 89

## 2020-04-06 DIAGNOSIS — N96 Recurrent pregnancy loss: Secondary | ICD-10-CM

## 2020-04-06 DIAGNOSIS — Z363 Encounter for antenatal screening for malformations: Secondary | ICD-10-CM | POA: Diagnosis present

## 2020-04-07 ENCOUNTER — Other Ambulatory Visit: Payer: Self-pay | Admitting: Obstetrics and Gynecology

## 2020-04-07 DIAGNOSIS — O2620 Pregnancy care for patient with recurrent pregnancy loss, unspecified trimester: Secondary | ICD-10-CM

## 2020-04-07 DIAGNOSIS — O09899 Supervision of other high risk pregnancies, unspecified trimester: Secondary | ICD-10-CM

## 2020-04-07 DIAGNOSIS — Z9889 Other specified postprocedural states: Secondary | ICD-10-CM

## 2020-04-13 ENCOUNTER — Telehealth: Payer: Self-pay

## 2020-04-13 ENCOUNTER — Encounter

## 2020-04-13 NOTE — Telephone Encounter (Signed)
Spoke with patient - need to reschedule anatomy ultrasound appointment due to short ultrasound tech. on 05/22/20.  Moved her to Monday 05/15/20 @ 7:30am

## 2020-04-20 ENCOUNTER — Other Ambulatory Visit: Payer: Self-pay

## 2020-04-20 ENCOUNTER — Ambulatory Visit: Payer: Self-pay | Admitting: Genetic Counselor

## 2020-04-20 ENCOUNTER — Ambulatory Visit: Attending: Obstetrics and Gynecology | Admitting: Genetic Counselor

## 2020-04-20 DIAGNOSIS — N96 Recurrent pregnancy loss: Secondary | ICD-10-CM

## 2020-04-20 DIAGNOSIS — Z8489 Family history of other specified conditions: Secondary | ICD-10-CM

## 2020-04-20 DIAGNOSIS — Z8279 Family history of other congenital malformations, deformations and chromosomal abnormalities: Secondary | ICD-10-CM | POA: Diagnosis not present

## 2020-04-20 DIAGNOSIS — Z315 Encounter for genetic counseling: Secondary | ICD-10-CM

## 2020-04-20 NOTE — Progress Notes (Addendum)
ADDENDUM 06/02/20: I called [name] several weeks ago to discuss answers to her questions. Specifically, I was unable to find information about the incidence of the 4G/4G allele and did not find any reports of HLA antigens in relation to miscarriages of female fetuses specifically in the literature. Kaitlin Ward was understanding.  -----------------------------------------------------------------------------------------------------------------------  04/20/2020  Kaitlin Ward 09-30-1985 MRN: [id number] DOV: 04/20/2020  Kaitlin Ward presented to the [facility] for Maternal Fetal Care for a genetics consultation regarding her history of recurrent pregnancy loss. Kaitlin Ward presented to her appointment alone.   Indication for genetic counseling - Recurrent pregnancy loss  Prenatal history  Kaitlin Ward is a [id number], 35 y.o. female. Her current pregnancy has completed [redacted]w[redacted]d(Estimated Date of Delivery: 10/08/20). Kaitlin Ward her husband have a three year old son together. They have also had a miscarriage at 7 weeks, a miscarriage around 7-8 weeks, a miscarriage at 9 weeks, and a chemical pregnancy. The current pregnancy was achieved via timed intercourse with the use of gonadotropins.  Kaitlin Ward exposure to environmental toxins or chemical agents. She denied the use of alcohol, tobacco or street drugs. She previously consulted with Dr. SDonalee Citrinregarding her medication list. She denied significant bleeding during the course of her pregnancy. She reported having COVID around 11-12 weeks' gestation. Her only symptoms were similar to a sinus infection and she did not have a fever at any point. Ms. KCharlsonreported a personal history of endometriosis and possible antiphospholipid syndrome. She has never had a blood clot. She had HPV requiring a LEEP procedure. Her son was born via Cesarean section and she needed a D&E for two of her prior losses. Her medical and surgical histories were otherwise  noncontributory.  Family History  A three generation pedigree was drafted and reviewed. The family history is remarkable for the following:  - Ms. KGaganhas a paternal uncle with hemochromatosis. He reportedly has "too much iron" and has to give blood routinely. Hemochromatosis is generally inherited in an autosomal recessive fashion. It demonstrates incomplete penetrance, meaning not everyone who has the condition will display symptoms. Kaitlin Ward does not have symptoms of hemachromatosis; however, the possibility of him being affected with no symptoms or a carrier cannot be ruled out. Carrier screening for hemochromatosis is available should Kaitlin Ward interested in pursuing this.  - Kaitlin Ward in law and father in law have factor V Leiden. Her father in law has a history of multiple blood clots but her sister in law has not yet had a blood clot. Given the family history, Ms. KHickeyhusband has a 50% chance of having factor V Leiden.  - [name] in law had a history of recurrent miscarriages. One of the fetuses was found to have CFederated Department StoresChat syndrome. Cri du Chat syndrome is caused by a deletion of genetic material near the end of the short arm of chromosome 5. Most cases occur sporadically and are not inherited. Given that no one else in Ms. KAndreasonhusband's family has features of Cri du Chat syndrome, the risk of recurrence for the couple's children is likely low.  - Ms. KHassinghusband has a second or third cousin with Noonan syndrome. Noonan syndrome is a genetic condition that can be caused by pathogenic variants in several genes. Noonan syndrome may be inherited in an autosomal dominant fashion; however, many cases of Noonan syndrome are de novo, occurring for the first time in an affected individual. Given that  no one else in [name] husband's family has features of Noonan syndrome, his cousin's Noonan syndrome likely occurred de novo. [name] had  NIPS for single gene conditions such as Noonan syndrome during the current pregnancy which was low-risk. See Discussion section for more details.   The remaining family histories were reviewed and found to be noncontributory for birth defects, intellectual disability, recurrent pregnancy loss, and known genetic conditions.    The patient's ancestry is [place] and [place]. The father of the pregnancy's ancestry is [place] and [place]. Ashkenazi Jewish ancestry and consanguinity were denied. Pedigree will be scanned under Media.  Discussion  Recurrent pregnancy loss:  [name] was referred for genetic counseling due to her history of recurrent pregnancy loss. She has had four spontaneous miscarriages with the same partner. 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 Genetic analysis was not performed for any of her other losses.   We reviewed that there are many potential causes of recurrent miscarriages, including anatomic, immunologic, endocrine, and genetic factors. However, a cause is only identified in 50% of individuals who experience recurrent miscarriages. Anatomic factors associated with recurrent miscarriages can include septate uterus, leiomyoma, intrauterine adhesions, and cervical insufficiency. Immunologic factors such as antiphospholipid syndrome, an autoimmune disorder associated with inappropriate blood clot formation, account for 15-40% of recurrent miscarriages. Endocrine factors such as polycystic ovarian syndrome and poorly controlled diabetes can also contribute to recurrent pregnancy loss. Genetic factors may include chromosomal abnormalities and single gene disorders. We discussed that 2-5% of couples who experience multiple miscarriages carry a balanced chromosomal rearrangement that can become unbalanced and potentially lethal in their offspring.   [name] has had extensive testing performed to attempt to  identify the underlying etiology for her history of pregnancy losses. She has seen both a reproductive endocrinology and a reproductive immunologist who have ordered a battery of  tests for her. She has had a normal sonohysterogram. Her lupus anticoagulant panel, beta-2 glycoprotein, factor V Leiden, and prothrombin mutation tests were all unremarkable. Both she and her husband reportedly had karyotype analyses which were normal; however, these reports were not available for my review at the time of this appointment.   Ms. Normoyle has a history of severe endometriosis that was diagnosed during her Cesarean section with her son. Her anticardiolipin IgM antibodies were in the low positive range, suggesting a possible diagnosis of antiphospholipid syndrome. 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 Records also indicate that Ms. Troyer has variants in the MTHFR gene; however, these results were not available for my review.  Ms. Shad had specific questions about her PAI-1 status today. The plasminogen activator inhibitor 1 (PAI-1) protein, which is encoded by the SERPINE1 gene, is involved in blood clotting. The 4G/5G polymorphism in the promoter region of this gene is a determinant of PAI-1 expression. Individuals who are homozygous for the 4G allele (4G/4G) have higher PAI-1 expression. It has been suggested that elevated levels of PAI-1 may be associated with an increased risk for venous thromboembolism (VTE) and coronary artery disease. Some studies suggest that there may be an association between PAI-1 polymorphisms and adverse pregnancy outcomes. However, per ACOG, there are inconsistent associations between inherited thrombophilias such as PAI-1 and recurrent pregnancy loss. Screening for inherited thrombophilias is not currently recommended for women with a history of fetal loss due to the fact that a causal association has not been established. Thus,  it is currently  unclear if Ms. Mastel 4G/4G PAI-1 polymorphism is related to her history of recurrent pregnancy loss.  Ms. Drollinger had Anora testing on products of conception following her third loss. Anora identified that the fetus had trisomy 57. Complete trisomy 15 is caused by the presence of an entire extra copy of chromosome 15. Most embryos/fetuses with full trisomy 15 miscarry spontaneously. It accounts for ~45% of chromosomally abnormal miscarriages. Ms. Cardosa was counseled that trisomy 15 generally occurs sporadically and that the chance of recurrence is likely low. Trisomy 15 is unrelated to any of Ms. Molner other findings.  Expanded carrier screening:   Ms. Osterlund was counseled about expanded carrier screening (ECS) as an additional available testing option for her history of recurrent pregnancy loss. ECS evaluates an individual's carrier status for hundreds of recessive and X-linked conditions, some of which could be associated with pregnancy loss. If Ms. Silverthorne were found to be a carrier for a recessive condition, we would recommend evaluating her husband to determine if he carries the same condition. This could help determine whether their pregnancies are at increased risk for a particular genetic condition. Ms. Scarpati declined ECS at this time.  Genetic screening results:  We reviewed that during the current pregnancy, Ms. Cowens had Panorama noninvasive prenatal screening (NIPS) through the laboratory [facility] that was low-risk for fetal aneuploidies. We reviewed that these results showed a less than 1 in 10,000 risk for trisomies 21, 18 and 13, and monosomy X (Turner syndrome).  In addition, the risk for triploidy and sex chromosome trisomies (47,XXX and 47,XXY) was also low. Ms. Broxterman elected to have cfDNA analysis for 22q11.2 deletion syndrome, which was also low risk (1 in 3100). We reviewed that while this testing identifies 94-99% of pregnancies with trisomy 53, trisomy 60, trisomy 79, sex  chromosome aneuploidies, and triploidy, it is NOT diagnostic. A positive test result requires confirmation by CVS or amniocentesis, and a negative test result does not rule out a fetal chromosome abnormality. She also understands that this testing does not identify all genetic conditions.  Ms. Radin inquired if analysis for Cri du Chat syndrome was included on Panorama NIPS. I informed her that the only microdeletion analysis that was ordered according to her NIPS report was for 22q11.2 deletion syndrome. She was interesting in adding on analysis for other microdeletion syndromes such as Cri du Chat syndrome given her husband's family history. I informed her that I was uncertain if this could be added on for a sample that already had results returned; however, I will contact [facility] to see if this could be done. If it cannot, we discussed the option of MaterniT Genome. MaterniT Genome is genome-wide NIPS that provides information about seven clinically relevant microdeletion regions (including that associated with [employer] Chat syndrome) and gains or losses of chromosomal material ? 7 Mb across all chromosomes. Like Panorama NIPS, MaterniT Genome is not diagnostic. We discussed that unfortunately, it is unlikely that her insurance company [employer]) would cover the cost of MaterniT Genome NIPS.  Additionally, we reviewed that Ms. Peraza had a sample drawn for Vistara single gene NIPS given her husband's family history of Noonan syndrome. Results from [place] were low-risk as no pathogenic variants were identified in the genes included on the panel (including several genes associated with Noonan syndrome). This decreases the chance of the fetus having one of the single gene conditions listed separately on the report. We again reviewed thatVistara cannot be considered diagnostic. Some cases of  these conditions may be missed by these screens, and it cannot rule out all possible genetic conditions.Ms. Dingley was  counseled that 20% of cases of Noonan syndrome do not have an identified genetic cause, and thus would escape detection by Vistara NIPS.  Diagnostic testing:  Finally, Ms. Kilroy was counseled regarding diagnostic testing via amniocentesis available from 16 weeks' gestation. We discussed the technical aspects of the procedure and quoted up to a 1 in 500 (0.2%) risk for spontaneous pregnancy loss or other adverse pregnancy outcomes as a result of amniocentesis. Cultured cells from an amniocentesis sample allow for the visualization of a fetal karyotype, which can detect >99% of large chromosomal aberrations. Chromosomal microarray can also be performed to identify smaller deletions or duplications of fetal chromosomal material. After careful consideration, Ms. Lukic informed me that she is not interested in amniocentesis given her history of pregnancy loss. She understands that amniocentesis is available at any point after 16 weeks of pregnancy and that she may opt to undergo the procedure at a later date should she change her mind.  Plan:  Ms. Puello declined further testing at today's appointment. I will explore whether or not it is possible to add on additional microdeletion analysis to Ms. Osgood Panorama sample and let her know.   Ms. Slinger is going to send me a copy of some of her test results from her various work-ups that I did not have access to, such as her karyotype result. 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 I will research these questions and contact Ms. Robeck once I have found answers for her. Given that I will be out of the office next week, I likely will not be in touch with Ms. Flitton until the following week.  I counseled Ms. Walth regarding the above risks and available options.  The approximate face-to-face time with the genetic counselor was 50 minutes.  In summary:  Discussed history of recurrent pregnancy loss  Negative karyotype for patient and her husband (by report)  Endometriosis and possible antiphospholipid syndrome may be contributing to history   Significance of PAI-1 polymorphism is unclear  Reviewed family history concerns  Previous fetus with trisomy 70   Husband has family history of Noonan syndrome and Cri du Chat syndrome  Reviewed Panorama NIPS and Vistara results  Reduction in risk for Down syndrome, trisomy 75, trisomy 59, triploidy, sex chromosome aneuploidies, and 22q11.2 deletion syndrome   Low-risk for 25 conditions on Vistara screen, including Noonan syndrome (residual risk remains)  Offered additional testing and screening  Interested in adding on additional microdeletion analysis to NIPS sample if possible. If not possible on Panorama, could consider MaterniT Genome  Declined amniocentesis  Declined expanded carrier screening   Buelah Manis, MS, Counselling psychologist

## 2020-04-24 ENCOUNTER — Encounter: Payer: Self-pay | Admitting: Genetic Counselor

## 2020-05-02 DIAGNOSIS — J45909 Unspecified asthma, uncomplicated: Secondary | ICD-10-CM | POA: Insufficient documentation

## 2020-05-15 ENCOUNTER — Ambulatory Visit: Admitting: *Deleted

## 2020-05-15 ENCOUNTER — Other Ambulatory Visit: Payer: Self-pay

## 2020-05-15 ENCOUNTER — Encounter: Payer: Self-pay | Admitting: *Deleted

## 2020-05-15 ENCOUNTER — Other Ambulatory Visit: Payer: Self-pay | Admitting: *Deleted

## 2020-05-15 ENCOUNTER — Ambulatory Visit: Attending: Obstetrics and Gynecology

## 2020-05-15 VITALS — BP 114/56 | HR 67

## 2020-05-15 DIAGNOSIS — O09899 Supervision of other high risk pregnancies, unspecified trimester: Secondary | ICD-10-CM | POA: Diagnosis not present

## 2020-05-15 DIAGNOSIS — Z9889 Other specified postprocedural states: Secondary | ICD-10-CM | POA: Diagnosis not present

## 2020-05-15 DIAGNOSIS — Z363 Encounter for antenatal screening for malformations: Secondary | ICD-10-CM | POA: Insufficient documentation

## 2020-05-15 DIAGNOSIS — O2620 Pregnancy care for patient with recurrent pregnancy loss, unspecified trimester: Secondary | ICD-10-CM | POA: Diagnosis not present

## 2020-05-15 DIAGNOSIS — Z362 Encounter for other antenatal screening follow-up: Secondary | ICD-10-CM

## 2020-05-19 ENCOUNTER — Other Ambulatory Visit

## 2020-05-19 ENCOUNTER — Ambulatory Visit

## 2020-05-19 ENCOUNTER — Encounter

## 2020-05-22 ENCOUNTER — Ambulatory Visit

## 2020-06-13 ENCOUNTER — Ambulatory Visit

## 2020-06-27 ENCOUNTER — Other Ambulatory Visit: Payer: Self-pay

## 2020-07-07 LAB — OB RESULTS CONSOLE HIV ANTIBODY (ROUTINE TESTING): HIV: NONREACTIVE

## 2020-09-18 ENCOUNTER — Encounter (HOSPITAL_COMMUNITY): Payer: Self-pay

## 2020-09-18 ENCOUNTER — Encounter (HOSPITAL_COMMUNITY): Payer: Self-pay | Admitting: Obstetrics and Gynecology

## 2020-09-18 NOTE — Patient Instructions (Addendum)
Kaitlin Ward  09/18/2020   Your procedure is scheduled on:  09/19/2020  Arrive at 0630 at [employer] [facility] on [address] at [facility]  and [address]. You are invited to use the FREE valet parking or use the Visitor's parking deck.  Pick up the phone at the desk and dial [phone].  Call this number if you have problems the morning of surgery: [phone]  Remember:   Do not eat food:(After Midnight) Desps de medianoche.  Do not drink clear liquids: (After Midnight) Desps de medianoche.  Take these medicines the morning of surgery with A SIP OF WATER:  None take heparin the night before surgery no later than 7:30 PM   Do not wear jewelry, make-up or nail polish.  Do not wear lotions, powders, or perfumes. Do not wear deodorant.  Do not shave 48 hours prior to surgery.  Do not bring valuables to the hospital.  [facility] is not   responsible for any belongings or valuables brought to the hospital.  Contacts, dentures or bridgework may not be worn into surgery.  Leave suitcase in the car. After surgery it may be brought to your room.  For patients admitted to the hospital, checkout time is 11:00 AM the day of              discharge.      Please read over the following fact sheets that you were given:     Preparing for Surgery

## 2020-09-18 NOTE — Patient Instructions (Signed)
Kaitlin Ward  09/18/2020   Your procedure is scheduled on:  09/20/2020  Arrive at 0630 at [employer] [facility] on [address] at [facility]  and [employer]. You are invited to use the FREE valet parking or use the Visitor's parking deck.  Pick up the phone at the desk and dial [phone].  Call this number if you have problems the morning of surgery: [phone]  Remember:   Do not eat food:(After Midnight) Desps de medianoche.  Do not drink clear liquids: (After Midnight) Desps de medianoche.  Take these medicines the morning of surgery with A SIP OF WATER:  Do not administer heparin the night before or the morning of surgery.  No medications the morning of surgery   Do not wear jewelry, make-up or nail polish.  Do not wear lotions, powders, or perfumes. Do not wear deodorant.  Do not shave 48 hours prior to surgery.  Do not bring valuables to the hospital.  [facility] is not   responsible for any belongings or valuables brought to the hospital.  Contacts, dentures or bridgework may not be worn into surgery.  Leave suitcase in the car. After surgery it may be brought to your room.  For patients admitted to the hospital, checkout time is 11:00 AM the day of              discharge.      Please read over the following fact sheets that you were given:     Preparing for Surgery

## 2020-09-19 ENCOUNTER — Other Ambulatory Visit: Payer: Self-pay | Admitting: Obstetrics and Gynecology

## 2020-09-19 ENCOUNTER — Encounter (HOSPITAL_COMMUNITY)
Admission: RE | Admit: 2020-09-19 | Discharge: 2020-09-19 | Disposition: A | Discharge status: Home / Self Care | Source: Ambulatory Visit | Attending: Obstetrics and Gynecology | Admitting: Obstetrics and Gynecology

## 2020-09-19 ENCOUNTER — Other Ambulatory Visit: Payer: Self-pay

## 2020-09-19 DIAGNOSIS — Z20822 Contact with and (suspected) exposure to covid-19: Secondary | ICD-10-CM | POA: Insufficient documentation

## 2020-09-19 DIAGNOSIS — Z01812 Encounter for preprocedural laboratory examination: Secondary | ICD-10-CM | POA: Insufficient documentation

## 2020-09-19 LAB — COMPREHENSIVE METABOLIC PANEL
ALT: 15 U/L (ref 0–44)
AST: 22 U/L (ref 15–41)
Albumin: 3.1 g/dL — ABNORMAL LOW (ref 3.5–5.0)
Alkaline Phosphatase: 117 U/L (ref 38–126)
Anion gap: 10 (ref 5–15)
BUN: 10 mg/dL (ref 6–20)
CO2: 23 mmol/L (ref 22–32)
Calcium: 9.8 mg/dL (ref 8.9–10.3)
Chloride: 102 mmol/L (ref 98–111)
Creatinine, Ser: 0.89 mg/dL (ref 0.44–1.00)
GFR, Estimated: >60 mL/min (ref >60)
Glucose, Bld: 82 mg/dL (ref 70–99)
Potassium: 4.4 mmol/L (ref 3.5–5.1)
Sodium: 135 mmol/L (ref 135–145)
Total Bilirubin: 0.3 mg/dL (ref 0.3–1.2)
Total Protein: 6.3 g/dL — ABNORMAL LOW (ref 6.5–8.1)

## 2020-09-19 LAB — CBC
HCT: 35.9 % — ABNORMAL LOW (ref 36.0–46.0)
Hemoglobin: 12.3 g/dL (ref 12.0–15.0)
MCH: 32.8 pg (ref 26.0–34.0)
MCHC: 34.3 g/dL (ref 30.0–36.0)
MCV: 95.7 fL (ref 80.0–100.0)
Platelets: 150 10*3/uL (ref 150–400)
RBC: 3.75 MIL/uL — ABNORMAL LOW (ref 3.87–5.11)
RDW: 12.1 % (ref 11.5–15.5)
WBC: 5.5 10*3/uL (ref 4.0–10.5)
nRBC: 0 % (ref 0.0–0.2)

## 2020-09-19 LAB — PROTIME-INR
INR: 1 (ref 0.8–1.2)
Prothrombin Time: 13 seconds (ref 11.4–15.2)

## 2020-09-19 LAB — TYPE AND SCREEN
ABO/RH(D): A POS
Antibody Screen: NEGATIVE

## 2020-09-19 LAB — APTT: aPTT: 30 seconds (ref 24–36)

## 2020-09-19 NOTE — H&P (Addendum)
Kaitlin Ward is a 35 y.o. female presenting for repeat c/s.  Previous c/s for failure to progress and desires repeat.  Pregnancy complicated by Cholestasis of pregnancy and recommended delivery at 37 weeks Infertilty hx with lovenox switched to heparin and d/c'd 09/18/20 Hx of severe endometriosis Recent ultrasound shows EF 97%tile OB History     Gravida  6   Para  1   Term  1   Preterm      AB  4   Living  1      SAB  4   IAB      Ectopic      Multiple  0   Live Births  1          Past Medical History:  Diagnosis Date   Anxiety    APS (antiphospholipid syndrome) (HCC)    suspected   Asthma    exercise induce   Cholestasis during pregnancy    Depression    Endometriosis    GERD (gastroesophageal reflux disease)    with pregancy   HPV (human papilloma virus) anogenital infection    Vaginal Pap smear, abnormal    Past Surgical History:  Procedure Laterality Date   CESAREAN SECTION N/A 08/27/2016   Procedure: CESAREAN SECTION;  Surgeon: Noland Fordyce, MD;  Location: [facility]ING SU[facility];  Service: Obstetrics;  Laterality: N/A;   COLPOSCOPY     DILATION AND EVACUATION  02/2019   DILATION AND EVACUATION N/A 08/13/2019   Procedure: DILATATION AND EVACUATION;  Surgeon: Ranae Pila, MD;  Location: [facility] OR;  Service: Gynecology;  Laterality: N/A;  DR. Elon Spanner IS ON CALL   WISDOM TOOTH EXTRACTION     Family History: family history includes Diabetes in her father; Hypertension in her maternal grandmother and paternal grandmother; Leukemia in her maternal grandmother; Rheum arthritis in her paternal grandmother; Vascular Disease in her father. Social History:  reports that she has never smoked. She has never used smokeless tobacco. She reports that she does not currently use alcohol. She reports that she does not use drugs.     Maternal Diabetes: No Genetic Screening: Declined Maternal Ultrasounds/Referrals: Normal Fetal Ultrasounds or other Referrals:   Referred to Materal Fetal Medicine  Maternal Substance Abuse:  No Significant Maternal Medications:  Meds include: Other: ursodiol, previously lovenox/heparin Significant Maternal Lab Results:  Group B Strep negative Other Comments:  None  Review of Systems History   Height 5\' 5"  (1.651 m), weight 72.6 kg, unknown if currently breastfeeding. Exam Physical Exam Vitals and nursing note reviewed. Exam conducted with a chaperone present.  Constitutional:      Appearance: Normal appearance.  HENT:     Head: Normocephalic.  Eyes:     Pupils: Pupils are equal, round, and reactive to light.  Cardiovascular:     Rate and Rhythm: Normal rate and regular rhythm.     Pulses: Normal pulses.  Abdominal:     General: Abdomen is Gravid, nontender Neurological:     Mental Status: She is alert.  Prenatal labs: ABO, Rh: --/--/A POS (08/16 1046) Antibody: NEG (08/16 1046) Rubella: Immune (02/15 0000) RPR: Nonreactive (02/15 0000)  HBsAg: Negative (02/15 0000)  HIV: Non-reactive (06/03 0000)  GBS:     Assessment/Plan: IUP at 37 3/7 Previous c/s for repeat Cholestasis of pregnancy for delivery at 37 weeks Previous anticoagulation for fertility reasons, now d/c'd Risks and benefits of C/S were discussed.  All questions were answered and informed consent was obtained.  Plan to proceed with low  segment transverse Cesarean Section.    Kaitlin Ward 09/19/2020, 7:00 PM  This patient has been seen and examined.   All of her questions were answered.  Labs and vital signs reviewed.  Informed consent has been obtained.  The History and Physical is current. DL

## 2020-09-19 NOTE — Anesthesia Preprocedure Evaluation (Deleted)
Anesthesia Evaluation  Patient identified by MRN, date of birth, ID band  Reviewed: Allergy & Precautions, NPO status , Patient's Chart, lab work & pertinent test results  Airway Mallampati: II  TM Distance: >3 FB Neck ROM: Full    Dental no notable dental hx.    Pulmonary asthma ,    Pulmonary exam normal breath sounds clear to auscultation       Cardiovascular negative cardio ROS Normal cardiovascular exam Rhythm:Regular Rate:Normal     Neuro/Psych PSYCHIATRIC DISORDERS Anxiety Depression    GI/Hepatic Neg liver ROS, GERD  ,  Endo/Other    Renal/GU negative Renal ROS  negative genitourinary   Musculoskeletal negative musculoskeletal ROS (+)   Abdominal   Peds negative pediatric ROS (+)  Hematology Antiphospholipid Ab syndrome   Anesthesia Other Findings   Reproductive/Obstetrics (+) Pregnancy                                                             Anesthesia Evaluation  Patient identified by MRN, date of birth, ID band Patient awake    Reviewed: Allergy & Precautions, Patient's Chart, lab work & pertinent test results  Airway Mallampati: II  TM Distance: >3 FB Neck ROM: Full    Dental no notable dental hx. (+) Teeth Intact   Pulmonary neg pulmonary ROS,    Pulmonary exam normal breath sounds clear to auscultation       Cardiovascular negative cardio ROS Normal cardiovascular exam Rhythm:Regular Rate:Normal     Neuro/Psych negative neurological ROS  negative psych ROS   GI/Hepatic Neg liver ROS, GERD  Medicated and Controlled,  Endo/Other  negative endocrine ROS  Renal/GU negative Renal ROS  negative genitourinary   Musculoskeletal negative musculoskeletal ROS (+)   Abdominal   Peds  Hematology negative hematology ROS (+)   Anesthesia Other Findings   Reproductive/Obstetrics (+) Pregnancy HPV                              Anesthesia Physical Anesthesia Plan  ASA: II and emergent  Anesthesia Plan: Epidural   Post-op Pain Management:    Induction:   PONV Risk Score and Plan: 2 and Ondansetron, Dexamethasone, Scopolamine patch - Pre-op, Metaclopromide and Treatment may vary due to age or medical condition  Airway Management Planned: Natural Airway  Additional Equipment:   Intra-op Plan:   Post-operative Plan:   Informed Consent: I have reviewed the patients History and Physical, chart, labs and discussed the procedure including the risks, benefits and alternatives for the proposed anesthesia with the patient or authorized representative who has indicated his/her understanding and acceptance.     Plan Discussed with: Anesthesiologist, Surgeon and CRNA  Anesthesia Plan Comments: (Patient for C/section for arrest of descent. Will use epidural for C/section.)       Anesthesia Quick Evaluation  Anesthesia Physical  Anesthesia Plan  ASA: 2  Anesthesia Plan: Spinal   Post-op Pain Management:    Induction: Intravenous  PONV Risk Score and Plan: 2 and Ondansetron and Scopolamine patch - Pre-op  Airway Management Planned: Natural Airway  Additional Equipment:   Intra-op Plan:   Post-operative Plan:   Informed Consent: I have reviewed the patients History and Physical, chart, labs and discussed the procedure including the risks,  benefits and alternatives for the proposed anesthesia with the patient or authorized representative who has indicated his/her understanding and acceptance.     Dental advisory given  Plan Discussed with: CRNA and Anesthesiologist  Anesthesia Plan Comments: (Spinal. GETA as backup plan. Tanna Furry, MD  )       Anesthesia Quick Evaluation

## 2020-09-20 ENCOUNTER — Inpatient Hospital Stay (HOSPITAL_COMMUNITY): Admitting: Anesthesiology

## 2020-09-20 ENCOUNTER — Other Ambulatory Visit: Payer: Self-pay

## 2020-09-20 ENCOUNTER — Inpatient Hospital Stay (HOSPITAL_COMMUNITY)
Admission: RE | Admit: 2020-09-20 | Discharge: 2020-09-23 | DRG: 786 | Disposition: A | Discharge status: Home / Self Care | Attending: Obstetrics and Gynecology | Admitting: Obstetrics and Gynecology

## 2020-09-20 ENCOUNTER — Encounter (HOSPITAL_COMMUNITY)
Admission: RE | Disposition: A | Discharge status: Home / Self Care | Payer: Self-pay | Source: Home / Self Care | Attending: Obstetrics and Gynecology

## 2020-09-20 ENCOUNTER — Encounter (HOSPITAL_COMMUNITY): Payer: Self-pay | Admitting: Obstetrics and Gynecology

## 2020-09-20 DIAGNOSIS — O34211 Maternal care for low transverse scar from previous cesarean delivery: Principal | ICD-10-CM | POA: Diagnosis present

## 2020-09-20 DIAGNOSIS — O26893 Other specified pregnancy related conditions, third trimester: Secondary | ICD-10-CM | POA: Diagnosis present

## 2020-09-20 DIAGNOSIS — Z3A37 37 weeks gestation of pregnancy: Secondary | ICD-10-CM

## 2020-09-20 DIAGNOSIS — O9081 Anemia of the puerperium: Secondary | ICD-10-CM | POA: Diagnosis not present

## 2020-09-20 DIAGNOSIS — L299 Pruritus, unspecified: Secondary | ICD-10-CM | POA: Diagnosis present

## 2020-09-20 DIAGNOSIS — Z98891 History of uterine scar from previous surgery: Secondary | ICD-10-CM

## 2020-09-20 DIAGNOSIS — O2662 Liver and biliary tract disorders in childbirth: Secondary | ICD-10-CM | POA: Diagnosis present

## 2020-09-20 DIAGNOSIS — K831 Obstruction of bile duct: Secondary | ICD-10-CM | POA: Diagnosis present

## 2020-09-20 DIAGNOSIS — D62 Acute posthemorrhagic anemia: Secondary | ICD-10-CM | POA: Diagnosis not present

## 2020-09-20 HISTORY — DX: Liver and biliary tract disorders in pregnancy, unspecified trimester: O26.619

## 2020-09-20 HISTORY — DX: Obstruction of bile duct: K83.1

## 2020-09-20 LAB — RPR: RPR Ser Ql: NONREACTIVE

## 2020-09-20 LAB — SARS CORONAVIRUS 2 (TAT 6-24 HRS): SARS Coronavirus 2: NEGATIVE

## 2020-09-20 SURGERY — Surgical Case
Anesthesia: Spinal

## 2020-09-20 MED ORDER — STERILE WATER FOR IRRIGATION IR SOLN
Status: DC | PRN
  Administered 2020-09-20: 1

## 2020-09-20 MED ORDER — SODIUM CHLORIDE 0.9% FLUSH: 3.0000 mL | INTRAVENOUS | Status: DC | PRN

## 2020-09-20 MED ORDER — SOD CITRATE-CITRIC ACID 500-334 MG/5ML PO SOLN
ORAL | Status: AC
  Filled 2020-09-20: qty 30

## 2020-09-20 MED ORDER — PHENYLEPHRINE HCL (PRESSORS) 10 MG/ML IV SOLN
INTRAVENOUS | Status: DC | PRN
  Administered 2020-09-20 (×2): 80 ug via INTRAVENOUS

## 2020-09-20 MED ORDER — NALBUPHINE HCL 10 MG/ML IJ SOLN: 5.0000 mg | INTRAMUSCULAR | Status: DC | PRN

## 2020-09-20 MED ORDER — ACETAMINOPHEN 10 MG/ML IV SOLN
INTRAVENOUS | Status: DC | PRN
  Administered 2020-09-20: 1000 mg via INTRAVENOUS

## 2020-09-20 MED ORDER — MEASLES, MUMPS & RUBELLA VAC IJ SOLR: 0.5000 mL | Freq: Once | INTRAMUSCULAR | Status: DC

## 2020-09-20 MED ORDER — SIMETHICONE 80 MG PO CHEW
80.0000 mg | CHEWABLE_TABLET | Freq: Three times a day (TID) | ORAL | Status: DC
  Administered 2020-09-21 – 2020-09-23 (×6): 80 mg via ORAL
  Filled 2020-09-20 (×6): qty 1

## 2020-09-20 MED ORDER — PHENYLEPHRINE 40 MCG/ML (10ML) SYRINGE FOR IV PUSH (FOR BLOOD PRESSURE SUPPORT)
PREFILLED_SYRINGE | INTRAVENOUS | Status: AC
  Filled 2020-09-20: qty 10

## 2020-09-20 MED ORDER — PRENATAL MULTIVITAMIN CH
1.0000 | ORAL_TABLET | Freq: Every day | ORAL | Status: DC
  Administered 2020-09-21 – 2020-09-23 (×3): 1 via ORAL
  Filled 2020-09-20 (×3): qty 1

## 2020-09-20 MED ORDER — ACETAMINOPHEN 325 MG PO TABS: 650.0000 mg | ORAL_TABLET | ORAL | Status: DC | PRN

## 2020-09-20 MED ORDER — SENNOSIDES-DOCUSATE SODIUM 8.6-50 MG PO TABS
2.0000 | ORAL_TABLET | ORAL | Status: DC
  Administered 2020-09-21 – 2020-09-23 (×3): 2 via ORAL
  Filled 2020-09-20 (×3): qty 2

## 2020-09-20 MED ORDER — SODIUM CHLORIDE 0.9 % IV SOLN
2.0000 g | INTRAVENOUS | Status: AC
  Administered 2020-09-20: 2 g via INTRAVENOUS

## 2020-09-20 MED ORDER — OXYCODONE HCL 5 MG PO TABS: 5.0000 mg | ORAL_TABLET | Freq: Once | ORAL | Status: DC | PRN

## 2020-09-20 MED ORDER — SOD CITRATE-CITRIC ACID 500-334 MG/5ML PO SOLN
30.0000 mL | Freq: Once | ORAL | Status: AC
  Administered 2020-09-20: 30 mL via ORAL

## 2020-09-20 MED ORDER — TETANUS-DIPHTH-ACELL PERTUSSIS 5-2.5-18.5 LF-MCG/0.5 IM SUSY: 0.5000 mL | PREFILLED_SYRINGE | Freq: Once | INTRAMUSCULAR | Status: DC

## 2020-09-20 MED ORDER — ORAL CARE MOUTH RINSE: 15.0000 mL | Freq: Once | OROMUCOSAL | Status: DC

## 2020-09-20 MED ORDER — OXYCODONE HCL 5 MG/5ML PO SOLN: 5.0000 mg | Freq: Once | ORAL | Status: DC | PRN

## 2020-09-20 MED ORDER — LACTATED RINGERS IV SOLN: INTRAVENOUS | Status: DC

## 2020-09-20 MED ORDER — SCOPOLAMINE 1 MG/3DAYS TD PT72
1.0000 | MEDICATED_PATCH | Freq: Once | TRANSDERMAL | Status: DC
  Administered 2020-09-20: 1.5 mg via TRANSDERMAL

## 2020-09-20 MED ORDER — MORPHINE SULFATE (PF) 0.5 MG/ML IJ SOLN
INTRAMUSCULAR | Status: AC
  Filled 2020-09-20: qty 10

## 2020-09-20 MED ORDER — PHENYLEPHRINE HCL-NACL 20-0.9 MG/250ML-% IV SOLN
INTRAVENOUS | Status: DC | PRN
  Administered 2020-09-20: 60 ug/min via INTRAVENOUS

## 2020-09-20 MED ORDER — CEFAZOLIN SODIUM-DEXTROSE 2-4 GM/100ML-% IV SOLN
INTRAVENOUS | Status: AC
  Filled 2020-09-20: qty 100

## 2020-09-20 MED ORDER — FENTANYL CITRATE (PF) 100 MCG/2ML IJ SOLN
INTRAMUSCULAR | Status: DC | PRN
  Administered 2020-09-20: 15 ug via INTRATHECAL

## 2020-09-20 MED ORDER — ACETAMINOPHEN 10 MG/ML IV SOLN
INTRAVENOUS | Status: AC
  Filled 2020-09-20: qty 100

## 2020-09-20 MED ORDER — DEXAMETHASONE SODIUM PHOSPHATE 4 MG/ML IJ SOLN
INTRAMUSCULAR | Status: DC | PRN
  Administered 2020-09-20: 4 mg via INTRAVENOUS

## 2020-09-20 MED ORDER — DEXAMETHASONE SODIUM PHOSPHATE 4 MG/ML IJ SOLN
INTRAMUSCULAR | Status: AC
  Filled 2020-09-20: qty 1

## 2020-09-20 MED ORDER — MENTHOL 3 MG MT LOZG: 1.0000 | LOZENGE | OROMUCOSAL | Status: DC | PRN

## 2020-09-20 MED ORDER — ZOLPIDEM TARTRATE 5 MG PO TABS: 5.0000 mg | ORAL_TABLET | Freq: Every evening | ORAL | Status: DC | PRN

## 2020-09-20 MED ORDER — OXYTOCIN-SODIUM CHLORIDE 30-0.9 UT/500ML-% IV SOLN: 2.5000 [IU]/h | INTRAVENOUS | Status: AC

## 2020-09-20 MED ORDER — BUPIVACAINE IN DEXTROSE 0.75-8.25 % IT SOLN
INTRATHECAL | Status: DC | PRN
  Administered 2020-09-20: 1.6 mL via INTRATHECAL

## 2020-09-20 MED ORDER — FENTANYL CITRATE (PF) 100 MCG/2ML IJ SOLN
INTRAMUSCULAR | Status: AC
  Filled 2020-09-20: qty 2

## 2020-09-20 MED ORDER — SCOPOLAMINE 1 MG/3DAYS TD PT72
MEDICATED_PATCH | TRANSDERMAL | Status: AC
  Filled 2020-09-20: qty 1

## 2020-09-20 MED ORDER — OXYTOCIN-SODIUM CHLORIDE 30-0.9 UT/500ML-% IV SOLN
INTRAVENOUS | Status: DC | PRN
  Administered 2020-09-20: 200 mL via INTRAVENOUS

## 2020-09-20 MED ORDER — ONDANSETRON HCL 4 MG/2ML IJ SOLN
INTRAMUSCULAR | Status: AC
  Filled 2020-09-20: qty 2

## 2020-09-20 MED ORDER — IBUPROFEN 600 MG PO TABS
600.0000 mg | ORAL_TABLET | Freq: Four times a day (QID) | ORAL | Status: DC | PRN
  Filled 2020-09-20: qty 1

## 2020-09-20 MED ORDER — DIPHENHYDRAMINE HCL 50 MG/ML IJ SOLN: 12.5000 mg | INTRAMUSCULAR | Status: DC | PRN

## 2020-09-20 MED ORDER — NALBUPHINE HCL 10 MG/ML IJ SOLN: 5.0000 mg | Freq: Once | INTRAMUSCULAR | Status: DC | PRN

## 2020-09-20 MED ORDER — POVIDONE-IODINE 10 % EX SWAB
2.0000 "application " | Freq: Once | CUTANEOUS | Status: AC
  Administered 2020-09-20: 2 via TOPICAL

## 2020-09-20 MED ORDER — MORPHINE SULFATE (PF) 0.5 MG/ML IJ SOLN
INTRAMUSCULAR | Status: DC | PRN
  Administered 2020-09-20: .15 mg via INTRATHECAL

## 2020-09-20 MED ORDER — OXYTOCIN-SODIUM CHLORIDE 30-0.9 UT/500ML-% IV SOLN
INTRAVENOUS | Status: AC
  Filled 2020-09-20: qty 500

## 2020-09-20 MED ORDER — MEPERIDINE HCL 25 MG/ML IJ SOLN: 6.2500 mg | INTRAMUSCULAR | Status: DC | PRN

## 2020-09-20 MED ORDER — COCONUT OIL OIL: 1.0000 "application " | TOPICAL_OIL | Status: DC | PRN

## 2020-09-20 MED ORDER — CHLORHEXIDINE GLUCONATE 0.12 % MT SOLN: 15.0000 mL | Freq: Once | OROMUCOSAL | Status: DC

## 2020-09-20 MED ORDER — DIPHENHYDRAMINE HCL 25 MG PO CAPS: 25.0000 mg | ORAL_CAPSULE | Freq: Four times a day (QID) | ORAL | Status: DC | PRN

## 2020-09-20 MED ORDER — KETOROLAC TROMETHAMINE 30 MG/ML IJ SOLN
30.0000 mg | Freq: Four times a day (QID) | INTRAMUSCULAR | Status: AC | PRN
Start: 2020-09-20 — End: 2020-09-21
  Administered 2020-09-20 (×3): 30 mg via INTRAVENOUS
  Filled 2020-09-20 (×2): qty 1

## 2020-09-20 MED ORDER — SIMETHICONE 80 MG PO CHEW: 80.0000 mg | CHEWABLE_TABLET | ORAL | Status: DC | PRN

## 2020-09-20 MED ORDER — ALBUTEROL SULFATE (2.5 MG/3ML) 0.083% IN NEBU: 3.0000 mL | INHALATION_SOLUTION | Freq: Four times a day (QID) | RESPIRATORY_TRACT | Status: DC | PRN

## 2020-09-20 MED ORDER — BUPIVACAINE IN DEXTROSE 0.75-8.25 % IT SOLN
INTRATHECAL | Status: AC
  Filled 2020-09-20: qty 2

## 2020-09-20 MED ORDER — FENTANYL CITRATE (PF) 100 MCG/2ML IJ SOLN: 25.0000 ug | INTRAMUSCULAR | Status: DC | PRN

## 2020-09-20 MED ORDER — OXYCODONE-ACETAMINOPHEN 5-325 MG PO TABS
1.0000 | ORAL_TABLET | ORAL | Status: DC | PRN
  Administered 2020-09-21: 1 via ORAL
  Administered 2020-09-22: 2 via ORAL
  Administered 2020-09-22 – 2020-09-23 (×5): 1 via ORAL
  Filled 2020-09-20 (×4): qty 1
  Filled 2020-09-20: qty 2
  Filled 2020-09-20 (×2): qty 1

## 2020-09-20 MED ORDER — ONDANSETRON HCL 4 MG/2ML IJ SOLN: 4.0000 mg | Freq: Three times a day (TID) | INTRAMUSCULAR | Status: DC | PRN

## 2020-09-20 MED ORDER — ONDANSETRON HCL 4 MG/2ML IJ SOLN
INTRAMUSCULAR | Status: DC | PRN
  Administered 2020-09-20: 4 mg via INTRAVENOUS

## 2020-09-20 MED ORDER — SODIUM CHLORIDE 0.9 % IV SOLN
INTRAVENOUS | Status: AC
  Filled 2020-09-20: qty 2

## 2020-09-20 MED ORDER — KETOROLAC TROMETHAMINE 30 MG/ML IJ SOLN
INTRAMUSCULAR | Status: AC
  Filled 2020-09-20: qty 1

## 2020-09-20 MED ORDER — PROMETHAZINE HCL 25 MG/ML IJ SOLN: 6.2500 mg | INTRAMUSCULAR | Status: DC | PRN

## 2020-09-20 MED ORDER — DIBUCAINE (PERIANAL) 1 % EX OINT: 1.0000 "application " | TOPICAL_OINTMENT | CUTANEOUS | Status: DC | PRN

## 2020-09-20 MED ORDER — KETOROLAC TROMETHAMINE 30 MG/ML IJ SOLN: 30.0000 mg | Freq: Four times a day (QID) | INTRAMUSCULAR | Status: AC | PRN

## 2020-09-20 MED ORDER — NALOXONE HCL 0.4 MG/ML IJ SOLN: 0.4000 mg | INTRAMUSCULAR | Status: DC | PRN

## 2020-09-20 MED ORDER — ACETAMINOPHEN 500 MG PO TABS
1000.0000 mg | ORAL_TABLET | Freq: Four times a day (QID) | ORAL | Status: AC
  Administered 2020-09-20 – 2020-09-21 (×3): 1000 mg via ORAL
  Filled 2020-09-20 (×3): qty 2

## 2020-09-20 MED ORDER — DIPHENHYDRAMINE HCL 25 MG PO CAPS: 25.0000 mg | ORAL_CAPSULE | ORAL | Status: DC | PRN

## 2020-09-20 MED ORDER — NALOXONE HCL 4 MG/10ML IJ SOLN
1.0000 ug/kg/h | INTRAVENOUS | Status: DC | PRN
  Filled 2020-09-20: qty 5

## 2020-09-20 MED ORDER — WITCH HAZEL-GLYCERIN EX PADS: 1.0000 "application " | MEDICATED_PAD | CUTANEOUS | Status: DC | PRN

## 2020-09-20 SURGICAL SUPPLY — 28 items
CHLORAPREP W/TINT 26ML (MISCELLANEOUS) ×2 IMPLANT
CLAMP CORD UMBIL (MISCELLANEOUS) IMPLANT
CLIP FILSHIE TUBAL LIGA STRL (Clip) IMPLANT
CLOTH BEACON ORANGE TIMEOUT ST (SAFETY) ×2 IMPLANT
DRSG OPSITE POSTOP 4X10 (GAUZE/BANDAGES/DRESSINGS) ×2 IMPLANT
ELECT REM PT RETURN 9FT ADLT (ELECTROSURGICAL) ×2
ELECTRODE REM PT RTRN 9FT ADLT (ELECTROSURGICAL) ×1 IMPLANT
EXTRACTOR VACUUM M CUP 4 TUBE (SUCTIONS) IMPLANT
GLOVE BIOGEL PI IND STRL 7.0 (GLOVE) ×1 IMPLANT
GLOVE BIOGEL PI INDICATOR 7.0 (GLOVE) ×1
GLOVE SURG ORTHO 8.0 STRL STRW (GLOVE) ×2 IMPLANT
GOWN STRL REUS W/TWL LRG LVL3 (GOWN DISPOSABLE) ×4 IMPLANT
KIT ABG SYR 3ML LUER SLIP (SYRINGE) ×2 IMPLANT
NEEDLE HYPO 25X5/8 SAFETYGLIDE (NEEDLE) ×2 IMPLANT
NS IRRIG 1000ML POUR BTL (IV SOLUTION) ×2 IMPLANT
PACK C SECTION WH (CUSTOM PROCEDURE TRAY) ×2 IMPLANT
PAD OB MATERNITY 4.3X12.25 (PERSONAL CARE ITEMS) ×2 IMPLANT
PENCIL SMOKE EVAC W/HOLSTER (ELECTROSURGICAL) ×2 IMPLANT
SUT MNCRL 0 VIOLET CTX 36 (SUTURE) ×3 IMPLANT
SUT MON AB 4-0 PS1 27 (SUTURE) ×2 IMPLANT
SUT MONOCRYL 0 CTX 36 (SUTURE) ×3
SUT PDS AB 1 CT  36 (SUTURE)
SUT PDS AB 1 CT 36 (SUTURE) IMPLANT
SUT VIC AB 1 CTX 36 (SUTURE)
SUT VIC AB 1 CTX36XBRD ANBCTRL (SUTURE) IMPLANT
TOWEL OR 17X24 6PK STRL BLUE (TOWEL DISPOSABLE) ×2 IMPLANT
TRAY FOLEY W/BAG SLVR 14FR LF (SET/KITS/TRAYS/PACK) ×2 IMPLANT
WATER STERILE IRR 1000ML POUR (IV SOLUTION) ×2 IMPLANT

## 2020-09-20 NOTE — Op Note (Signed)
Cesarean Section Procedure Note  Pre-operative Diagnosis: IUP at 37 1/2 weeks, Cholestasis of pregnancy, previous c/s for repeat, history of severe endometriosis with bowel and bladder adhesions  Post-operative Diagnosis: same  Surgeon: [name]   Assistants: none  Anesthesia: spinal  Procedure:  Low Segment Transverse cesarean section  Procedure Details  The patient was seen in the Holding Room. The risks, benefits, complications, treatment options, and expected outcomes were discussed with the patient.  The patient concurred with the proposed plan, giving informed consent.  The site of surgery properly noted/marked.. A Time Out was held and the above information confirmed.  After induction of anesthesia, the patient was draped and prepped in the usual sterile manner. A Pfannenstiel incision was made and carried down through the subcutaneous tissue to the fascia.  The previous irregular part of the incision was excised for cosmetic reasons (per patient request Fascial incision was made and extended transversely. The fascia was separated from the underlying rectus tissue superiorly and inferiorly. The peritoneum was identified and entered. Peritoneal incision was extended longitudinally. The utero-vesical peritoneal reflection was midway up the anterior surface of the uterus, was incised transversely and the bladder flap was bluntly freed from the lower uterine segment. No bowel adhesions noted. A low transverse uterine incision was made. Delivered from vertex presentation was a baby with Apgar scores assigned in NICU.   After the umbilical cord was clamped and cut cord blood was obtained for evaluation. The placenta was removed intact and appeared normal. The uterine outline, tubes and ovaries appeared normal. The uterine incision was closed with running locked sutures of 0 monocryl and imbricated with 0 monocryl. Hemostasis was observed. Lavage was carried out until clear. The peritoneum was  then closed with 0 monocryl and rectus muscles plicated in the midline.  After hemostasis was assured, the fascia was then reapproximated with running sutures of 0 Vicryl. Irrigation was applied and after adequate hemostasis was assured, the skin was reapproximated with subcutaneous sutures using 4-0 monocryl.  Instrument, sponge, and needle counts were correct prior the abdominal closure and at the conclusion of the case. The patient received 2 grams cefotetan preoperatively.  Findings: Viable  female went to NICU.  Bladder adhesions, no bowel adhesions.  Uterus tubes and ovaries appeared normal  Estimated Blood Loss:  325cc         Specimens: Placenta was sent to labor and delivery         Complications:  None

## 2020-09-20 NOTE — Lactation Note (Signed)
This note was copied from a baby's chart. Lactation Consultation Note Mother has initiated pumping with long term plan of exclusively pumping/ bottle feeding. She has initiated pumping today and has a pump for home use. She is aware of LC services in NICU.   POC: Mom to pump for 15 minutes q 3hours. HE 1-2 minutes q pumping to increase output Mom to wash pump pieces that touch her body or milk q each pumping Mom to bring any collected milk to NICU  Patient Name: Kaitlin Ward [id number] Date: 09/20/2020 Reason for consult: Initial assessment;NICU baby Age:34 hours  Maternal Data Has patient been taught Hand Expression?: Yes Does the patient have breastfeeding experience prior to this delivery?: No Pumping frequency: initiated pumping and HE p delivery Pump: Personal;DEBP (Medela PIS at home)  Feeding Mother's Current Feeding Choice: Breast Milk  Interventions  Education: pumping frequency and cleaning, HE Provided NICU book and LC brochure   Consult Status Consult Status: Follow-up Follow-up type: In-patient   Elder Negus, MA IBCLC 09/20/2020, 3:46 PM

## 2020-09-20 NOTE — Transfer of Care (Signed)
Immediate Anesthesia Transfer of Care Note  Patient: Kaitlin Ward  Procedure(s) Performed: REPEAT CESAREAN SECTION EDC: 10-08-20 ALLERG: NKDA  PREVIOUS X 1  Patient Location: PACU  Anesthesia Type:Spinal  Level of Consciousness: awake, alert  and oriented  Airway & Oxygen Therapy: Patient Spontanous Breathing  Post-op Assessment: Report given to RN and Post -op Vital signs reviewed and stable  Post vital signs: Reviewed and stable  Last Vitals:  Vitals Value Taken Time  BP 111/69 09/20/20 0935  Temp 36.5 C 09/20/20 0935  Pulse 61 09/20/20 0939  Resp 17 09/20/20 0939  SpO2 100 % 09/20/20 0939  Vitals shown include unvalidated device data.  Last Pain:  Vitals:   09/20/20 0935  TempSrc: Oral         Complications: No notable events documented.

## 2020-09-20 NOTE — Anesthesia Procedure Notes (Signed)
Spinal  Patient location during procedure: OR Start time: 09/20/2020 8:25 AM End time: 09/20/2020 8:30 AM Reason for block: surgical anesthesia Staffing Performed: anesthesiologist  Anesthesiologist: Mellody Dance, MD Preanesthetic Checklist Completed: patient identified, IV checked, risks and benefits discussed, surgical consent, monitors and equipment checked, pre-op evaluation and timeout performed Spinal Block Patient position: sitting Prep: DuraPrep Patient monitoring: cardiac monitor, continuous pulse ox and blood pressure Approach: midline Location: L3-4 Injection technique: single-shot Needle Needle type: Pencan  Needle gauge: 24 G Needle length: 9 cm Assessment Sensory level: T4 Events: CSF return Additional Notes Functioning IV was confirmed and monitors were applied. Sterile prep and drape, including hand hygiene and sterile gloves were used. The patient was positioned and the spine was prepped. The skin was anesthetized with lidocaine.  Free flow of clear CSF was obtained prior to injecting local anesthetic into the CSF.  The spinal needle aspirated freely following injection.  The needle was carefully withdrawn.  The patient tolerated the procedure well.

## 2020-09-20 NOTE — Anesthesia Preprocedure Evaluation (Signed)
Anesthesia Evaluation  Patient identified by MRN, date of birth, ID band Patient awake    Reviewed: Allergy & Precautions, NPO status , Patient's Chart, lab work & pertinent test results  Airway Mallampati: II  TM Distance: >3 FB Neck ROM: Full    Dental no notable dental hx.    Pulmonary asthma ,    Pulmonary exam normal breath sounds clear to auscultation       Cardiovascular negative cardio ROS Normal cardiovascular exam Rhythm:Regular Rate:Normal     Neuro/Psych PSYCHIATRIC DISORDERS Anxiety Depression negative neurological ROS     GI/Hepatic Neg liver ROS, GERD  Medicated,  Endo/Other  negative endocrine ROS  Renal/GU negative Renal ROS  negative genitourinary   Musculoskeletal negative musculoskeletal ROS (+)   Abdominal   Peds negative pediatric ROS (+)  Hematology antiphosopholipid syndrome   Anesthesia Other Findings   Reproductive/Obstetrics (+) Pregnancy                             Anesthesia Physical Anesthesia Plan  ASA: 2  Anesthesia Plan: Spinal   Post-op Pain Management:    Induction:   PONV Risk Score and Plan: 2 and Treatment may vary due to age or medical condition, Scopolamine patch - Pre-op and Ondansetron  Airway Management Planned: Natural Airway  Additional Equipment:   Intra-op Plan:   Post-operative Plan:   Informed Consent: I have reviewed the patients History and Physical, chart, labs and discussed the procedure including the risks, benefits and alternatives for the proposed anesthesia with the patient or authorized representative who has indicated his/her understanding and acceptance.     Dental advisory given  Plan Discussed with: CRNA and Anesthesiologist  Anesthesia Plan Comments: (Spinal. GETA as backup plan. Tanna Furry, MD  )        Anesthesia Quick Evaluation

## 2020-09-20 NOTE — Anesthesia Postprocedure Evaluation (Signed)
Anesthesia Post Note  Patient: Kaitlin Ward  Procedure(s) Performed: REPEAT CESAREAN SECTION EDC: 10-08-20 ALLERG: NKDA  PREVIOUS X 1     Patient location during evaluation: PACU Anesthesia Type: Spinal Level of consciousness: awake and alert Pain management: pain level controlled Vital Signs Assessment: post-procedure vital signs reviewed and stable Respiratory status: spontaneous breathing, nonlabored ventilation and respiratory function stable Cardiovascular status: blood pressure returned to baseline and stable Postop Assessment: no apparent nausea or vomiting, spinal receding, no headache and no backache Anesthetic complications: no   No notable events documented.  Last Vitals:  Vitals:   09/20/20 1109 09/20/20 1215  BP: (!) 143/94 (!) 145/85  Pulse: (!) 52 (!) 58  Resp: 12 16  Temp: (!) 36.3 C   SpO2: 100% 100%    Last Pain:  Vitals:   09/20/20 1109  TempSrc: Oral  PainSc: 2    Pain Goal:                   Mellody Dance

## 2020-09-21 LAB — CBC
HCT: 23.9 % — ABNORMAL LOW (ref 36.0–46.0)
Hemoglobin: 8.3 g/dL — ABNORMAL LOW (ref 12.0–15.0)
MCH: 33.1 pg (ref 26.0–34.0)
MCHC: 34.7 g/dL (ref 30.0–36.0)
MCV: 95.2 fL (ref 80.0–100.0)
Platelets: 105 10*3/uL — ABNORMAL LOW (ref 150–400)
RBC: 2.51 MIL/uL — ABNORMAL LOW (ref 3.87–5.11)
RDW: 12 % (ref 11.5–15.5)
WBC: 6.3 10*3/uL (ref 4.0–10.5)
nRBC: 0 % (ref 0.0–0.2)

## 2020-09-21 LAB — BIRTH TISSUE RECOVERY COLLECTION (PLACENTA DONATION)

## 2020-09-21 MED ORDER — IBUPROFEN 600 MG PO TABS
600.0000 mg | ORAL_TABLET | Freq: Four times a day (QID) | ORAL | Status: DC
  Administered 2020-09-21 – 2020-09-23 (×7): 600 mg via ORAL
  Filled 2020-09-21 (×7): qty 1

## 2020-09-21 MED ORDER — FERROUS SULFATE 325 (65 FE) MG PO TABS
325.0000 mg | ORAL_TABLET | Freq: Every day | ORAL | Status: DC
  Administered 2020-09-21 – 2020-09-23 (×3): 325 mg via ORAL
  Filled 2020-09-21 (×3): qty 1

## 2020-09-21 NOTE — Lactation Note (Signed)
This note was copied from a baby's chart. Lactation Consultation Note  Patient Name: Kaitlin Ward [id number] Date: 09/21/2020 Reason for consult: Follow-up assessment;NICU baby Age:34 hours  I followed up with [name]. She was using a manual pump upon entry. She was using a size 21 flange, and states that it is uncomfortable. She showed me some areas at the base of her nipple that were scratched, and she attributes this to poor flange fit. We looked a few flanges and tried her size 24 and a size 19 insert (Maymom), and at this time, the 24 was more comfortable. [name] declined coconut oil at this time, stating that she has a pumping spray.  I provided reassurance regarding milk volume in day 2 (baby is only 25 hours old), and reinforced that she may only yield droplets. We reviewed pumping consistently today for stimulation and I suggested that she could check with her RN on NICU to see if oral care is appropriate today.  All questions answered at this time.   Maternal Data Has patient been taught Hand Expression?: Yes  Feeding Mother's Current Feeding Choice: Breast Milk   Lactation Tools Discussed/Used Tools: Flanges Flange Size: 21;24;Other (comment) (flange fitting; size 24 most comfortable at this time) Breast pump type: Double-Electric Breast Pump Pump Education: Setup, frequency, and cleaning Reason for Pumping: NICU Pumping frequency: recommended q2-3 Pumped volume: 0 mL (droplets)  Interventions Interventions: DEBP;Hand pump;Education  Discharge    Consult Status Consult Status: Follow-up Follow-up type: In-patient    Walker Shadow 09/21/2020, 9:59 AM

## 2020-09-21 NOTE — Progress Notes (Signed)
Subjective: Postpartum Day 1: Cesarean Delivery Patient reports tolerating PO and no problems voiding.  Requests abdominal binder to help with support/discomfort with getting in and out of bed.  Pruritis beginning to improve; delivered for cholestasis.  Baby in NICU for suspected TTN following delivery but overnight, required chest tube placement for pneumothorax.  Objective: Vital signs in last 24 hours: Temp:  [97.3 F (36.3 C)-98.5 F (36.9 C)] 97.9 F (36.6 C) (08/18 0429) Pulse Rate:  [52-67] 59 (08/18 0429) Resp:  [11-18] 16 (08/18 0429) BP: (111-145)/(69-94) 124/76 (08/18 0429) SpO2:  [99 %-100 %] 100 % (08/18 0429)  Physical Exam:  General: alert, cooperative, and appears stated age 35: appropriate Uterine Fundus: firm Incision: Honeycomb 30% stained with old blood DVT Evaluation: No evidence of DVT seen on physical exam. Negative Homan's sign. No cords or calf tenderness.  Recent Labs    09/19/20 1041 09/21/20 0513  HGB 12.3 8.3*  HCT 35.9* 23.9*    Assessment/Plan: Status post Cesarean section. Doing well postoperatively.  Continue current care. Replace honeycomb dressing Abdominal binder ordered Acute blood loss anemia-FeSO4 ordered  Kaitlin Ward 09/21/2020, 9:03 AM

## 2020-09-22 NOTE — Lactation Note (Signed)
This note was copied from a baby's chart. Lactation Consultation Note  Patient Name: Kaitlin Ward [id number] Date: 09/22/2020 Reason for consult: Follow-up assessment;NICU baby;Early term 37-38.6wks Age:34 hours  Visited with mom of 26 hours old ETI NICU female, she's exclusively pumping and bottle feeding but she's mainly using the hand pump, mom voiced that's her preference.  She reports some breast sensitivity but already using a "spray" prior pumping which has coconut as the first ingredient. Mom still using the # 24 flanges, but she also has the # 21 and the # 19 inserts. Reviewed pumping schedule, lactogenesis II and benefits of breast milk for NICU babies.  Plan of care:  Encouraged mom to try pumping every 2-3 hours, at least 8 pumping sessions/24 hours She'll continue using her spray for breast care, she may also try a different ointment (an organic one).  FOB present and supportive. All questions and concerns answered, parents to call NICU LC for assistance PRN.  Maternal Data   Mom's milk supply is WNL; she feels like her breast are getting fuller today  Feeding Mother's Current Feeding Choice: Breast Milk  Lactation Tools Discussed/Used Tools: Pump;Flanges Flange Size: 21;24 (she uses her Mamoy # 19 insert with flange # 24) Breast pump type: Double-Electric Breast Pump;Manual Pump Education: Setup, frequency, and cleaning;Milk Storage Reason for Pumping: ETI in NICU Pumping frequency: q 3-4 hours Pumped volume: 6 mL  Interventions Interventions: Breast feeding basics reviewed;DEBP;Education  Discharge Pump: DEBP;Manual;Personal  Consult Status Consult Status: Follow-up Follow-up type: In-patient    Tim Wilhide Venetia Constable 09/22/2020, 6:46 PM

## 2020-09-22 NOTE — Progress Notes (Signed)
Postpartum Progress Note  Postpartum Day 2 s/p repeat Cesarean section.  Subjective:  Patient reports no overnight events.  She reports well controlled pain, ambulating without difficulty, voiding spontaneously, tolerating PO.  She reports small amount of flatus, Negative BM.  Vaginal bleeding is minimal.  Objective: Blood pressure 115/76, pulse 73, temperature 98.1 F (36.7 C), temperature source Oral, resp. rate 16, height 5\' 5"  (1.651 m), weight 70.7 kg, SpO2 98 %, unknown if currently breastfeeding.  Physical Exam:  General: alert and no distress Lochia: appropriate Uterine Fundus: firm Incision: dressing in place DVT Evaluation: No evidence of DVT seen on physical exam.  Recent Labs    09/19/20 1041 09/21/20 0513  HGB 12.3 8.3*  HCT 35.9* 23.9*    Assessment/Plan: Postpartum Day 2, s/p C-section Baby in NICU, pneumothorax Lactation following Doing well, continue routine postpartum care. Anticipate discharge tomorrow.   LOS: 2 days   09/23/20 09/22/2020, 7:35 AM

## 2020-09-23 MED ORDER — DOCUSATE SODIUM 100 MG PO CAPS: 100.0000 mg | ORAL_CAPSULE | Freq: Two times a day (BID) | ORAL | 0 refills | Status: DC

## 2020-09-23 MED ORDER — ACETAMINOPHEN 325 MG PO TABS: 650.0000 mg | ORAL_TABLET | ORAL | 0 refills | Status: DC | PRN

## 2020-09-23 MED ORDER — FERROUS SULFATE 325 (65 FE) MG PO TABS: 325.0000 mg | ORAL_TABLET | Freq: Every day | ORAL | 3 refills | Status: DC

## 2020-09-23 MED ORDER — ACETAMINOPHEN 325 MG PO TABS
650.0000 mg | ORAL_TABLET | ORAL | 0 refills | Status: DC | PRN
Start: 2020-09-23 — End: 2020-09-23

## 2020-09-23 MED ORDER — IBUPROFEN 600 MG PO TABS: 600.0000 mg | ORAL_TABLET | Freq: Four times a day (QID) | ORAL | 0 refills | Status: DC

## 2020-09-23 MED ORDER — OXYCODONE HCL 5 MG PO TABS: 5.0000 mg | ORAL_TABLET | ORAL | 0 refills | Status: DC | PRN

## 2020-09-23 NOTE — Progress Notes (Signed)
Postpartum Progress Note  Postpartum Day 3 s/p repeat Cesarean section.  Subjective:  Patient reports no overnight events.  She reports well controlled pain, ambulating without difficulty, voiding spontaneously, tolerating PO.  She reports small amount of flatus, positive BM.  Vaginal bleeding is minimal.  Objective: Blood pressure 125/84, pulse 79, temperature 98.4 F (36.9 C), temperature source Oral, resp. rate 19, height 5\' 5"  (1.651 m), weight 70.7 kg, SpO2 100 %, unknown if currently breastfeeding.  Physical Exam:  General: alert and no distress Lochia: appropriate Uterine Fundus: firm Incision: dressing in place DVT Evaluation: No evidence of DVT seen on physical exam.  Recent Labs    09/21/20 0513  HGB 8.3*  HCT 23.9*     Assessment/Plan: Postpartum Day 3, s/p C-section Baby boy in NICU, pneumothorax, improving Lactation following Postoperative anemia - Fe/colace Doing well, continue routine postpartum care. Anticipate discharge today.   LOS: 3 days   09/23/20 09/23/2020, 8:32 AM

## 2020-09-23 NOTE — Lactation Note (Signed)
This note was copied from a baby's chart. Lactation Consultation Note  Patient Name: Kaitlin Ward [id number] Date: 09/23/2020 Reason for consult: Follow-up assessment;NICU baby;Early term 37-38.6wks Age:34 hours  Visited with mom of 60 hours old ETI NICU female, she told LC that she woke up this morning with engorged breast, the left one being more painful than the right one.  [facility] RN Sherron Ales started her on ice packs and ibuprofen, breast feel hard and tender to touch upon examination. Encouraged mom to continue ice/hot therapy on the breast until engorgement resolves. LC refilled mom's ice packs and answered questions, she had 2 breast massages but voiced they're not helping.  She's been discharged today, reviewed engorgement/sore nipples prevention and treatment. She's already using her pump on maintenance mode, d/c initiation mode yesterday.   Plan of care:  Mom will continue applying ice packs for at least 20-30 minutes until breast soften. She'll follow with moist heat prior pumping She's now considering taking baby to breast, encouraged her to do as much STS as possible  FOB present. All questions and concerns answered, mom to call NICU LC PRN.  Maternal Data   Mom's milk has come in but she's engorged, and amounts expressed are BNL possibly due to interstitial swelling  Feeding Mother's Current Feeding Choice: Breast Milk  Lactation Tools Discussed/Used Tools: Pump;Flanges Flange Size: 24;21 Breast pump type: Double-Electric Breast Pump Pump Education: Setup, frequency, and cleaning;Milk Storage Reason for Pumping: ETI in NICU Pumping frequency: q 3 hours Pumped volume: 5 mL  Interventions Interventions: Breast feeding basics reviewed;DEBP;Ice;Education;Breast massage  Discharge Discharge Education: Engorgement and breast care Pump: DEBP;Manual;Personal  Consult Status Consult Status: Follow-up Follow-up type: In-patient    Odessie Polzin Venetia Constable 09/23/2020, 11:56  AM

## 2020-09-23 NOTE — Discharge Summary (Signed)
Obstetric Discharge Summary  Kaitlin Ward is a 35 y.o. female that presented on 09/20/2020 for repeat C section at 37 weeks. Pregnancy course notable for choelstasis and infertility on lovenox.  She was admitted to labor and delivery for her C section and delivered a viable female infant on 09/20/20. Baby was admitted to NICU for pneumothorax  Her postpartum course was uncomplicated and on PPD#3, she reported well controlled pain, spontaneous voiding, ambulating without difficulty, and tolerating PO.  She was stable for discharge home on 09/23/20 with plans for in-office follow up.  Hemoglobin  Date Value Ref Range Status  09/21/2020 8.3 (L) 12.0 - 15.0 g/dL Final    Comment:    REPEATED TO VERIFY  10/18/2019 12.9 12.0 - 15.0 g/dL Final   HCT  Date Value Ref Range Status  09/21/2020 23.9 (L) 36.0 - 46.0 % Final    Physical Exam:  General: alert and no distress Lochia: appropriate Uterine Fundus: firm Incision: healing well DVT Evaluation: No evidence of DVT seen on physical exam.  Discharge Diagnoses: Term Pregnancy-delivered  Discharge Information: Date: 09/23/2020 Activity: Pelvic rest, as tolerated Diet: routine Medications: Tylenol, motrin, oxycodone, iron, colace Condition: stable Instructions: Refer to practice specific booklet.  Discussed prior to discharge.  Discharge to: Home  Follow-up Information     [place] Follow up.   Why: Please follow up for 6 week postpartum visit. Contact information: [address] [place] [address] [phone]                 Newborn Data: Live born female  Birth Weight: 7 lb 6.9 oz (3370 g) APGAR: ,   Newborn Delivery   Birth date/time: 09/20/2020 08:55:00 Delivery type: C-Section, Low Transverse Trial of labor: No C-section categorization: Repeat        Lyn Henri 09/23/2020, 3:42 PM

## 2020-09-24 ENCOUNTER — Ambulatory Visit: Payer: Self-pay

## 2020-09-24 NOTE — Lactation Note (Signed)
This note was copied from a baby's chart. Lactation Consultation Note Mother continues to treat engorgement with ice/heat and frequent pumping. Breast are softer today, per mom, but milk volume remains low. Low volume today is likely an indicator of continued engorgement. It is also a risk factor of future low milk supply. LC reinforced earlier ed / poc for engorgement. Reviewed s/s mastitis. Will plan f/u tomorrow to further assess.   Patient Name: Kaitlin Ward [id number] Date: 09/24/2020 Reason for consult: Follow-up assessment;Breast augmentation Age:34 days  Maternal Data  Pumping frequency: q3 Pumped volume: 10 mL  Feeding Mother's Current Feeding Choice: Breast Milk and Donor Milk   Interventions Interventions: Ice;Education  Discharge Discharge Education: Engorgement and breast care  Consult Status Consult Status: Follow-up Follow-up type: In-patient   Elder Negus, MA IBCLC 09/24/2020, 3:20 PM

## 2020-09-25 ENCOUNTER — Ambulatory Visit: Payer: Self-pay

## 2020-09-25 NOTE — Lactation Note (Signed)
This note was copied from a baby's chart. Lactation Consultation Note Maternal engorgement is subsiding today but milk volume is low for day 5pp. Will continue to monitor.   Mom continues to pump frequently and is aware to continue pumping q3 and with HE/massage.   Patient Name: Kaitlin Ward [id number] Date: 09/25/2020 Reason for consult: Follow-up assessment;Engorgement Age:34 days  Maternal Data  Pumping frequency: q2-3 106mL per pump average today Hx nipple piercing Feeding Mother's Current Feeding Choice: Breast Milk  Interventions Interventions: Education IDF Discharge Discharge Education: Engorgement and breast care  Consult Status Consult Status: Follow-up Follow-up type: In-patient   Elder Negus 09/25/2020, 3:59 PM

## 2020-10-03 ENCOUNTER — Telehealth (HOSPITAL_COMMUNITY): Payer: Self-pay | Admitting: *Deleted

## 2020-10-03 NOTE — Telephone Encounter (Signed)
Left message to return nurse call.  Duffy Rhody, RN 10-03-2020 at 11:51am

## 2020-10-04 ENCOUNTER — Ambulatory Visit: Payer: Self-pay

## 2020-10-04 NOTE — Lactation Note (Signed)
This note was copied from a baby's chart. Lactation Consultation Note Mother continues to pump frequently but milk supply remains low. Her daily yield is about . We discussed strategies and potential causes for low milk supply. LC schedule an appointment for 2pm tomorrow to observe pumping session and provide further guidance.   Patient Name: Kaitlin Ward [id number] Date: 10/04/2020 Reason for consult: Follow-up assessment;Other (Comment) (low milk supply) Age:29 wk.o.  Maternal Data  Pumping frequency: q2-3 hours Pumped volume: 10 mL  Feeding Nipple Type: Dr. Levert Feinstein Preemie  Interventions Interventions: Education;DEBP  Consult Status Consult Status: Follow-up Date: 10/04/20 Follow-up type: In-patient  Elder Negus, MA IBCLC 10/04/2020, 5:47 PM

## 2020-10-05 ENCOUNTER — Ambulatory Visit: Payer: Self-pay

## 2020-10-05 NOTE — Lactation Note (Signed)
This note was copied from a baby's chart. Lactation Consultation Note  Patient Name: Kaitlin Ward [id number] Date: 10/05/2020 Reason for consult: Follow-up assessment;NICU baby Age:35 yr.o.  I followed up with Ms. Fuelling and observed her use the Symphony DEBP. She is using size 19 flange inserts (Maymom), and this is most comfortable. She has ordered silicone flanges and will test those out in the near future.  She is using the maintenance setting on her DEBP.  Ms. Gunner spoke with her OB about beginning Reglan to support milk production. Her OB recommended dietary supplements (and Fenugreek) as a first course. Ms. Vanderstelt has concerns about fenugreek. I recommended that she research moringa as an alternative.  Ms. Munoz power pumps one time a day and does breast massage after each pumping session. She states that her personal pump yields similar results as the Symphony.  She is considering doing some cluster pumping next. We discussed strategies to increase milk production.  Maternal Data Has patient been taught Hand Expression?: Yes Does the patient have breastfeeding experience prior to this delivery?: No (briefly pumped (2 times))  Feeding Mother's Current Feeding Choice: Breast Milk and Formula Nipple Type: Dr. Levert Feinstein Preemie  Lactation Tools Discussed/Used Tools: Pump;Flanges Flange Size: Other (comment) (19 maymom inserts) Breast pump type: Double-Electric Breast Pump Pump Education: Setup, frequency, and cleaning Reason for Pumping: NICU; supplementation; maternal plan Pumping frequency: q3 hours Pumped volume: 20 mL (up to one ounce/session/combined)  Interventions Interventions: Breast feeding basics reviewed;Breast massage;Education;DEBP  Discharge Pump: DEBP  Consult Status Consult Status: Follow-up Date: 10/05/20 Follow-up type: In-patient    Walker Shadow 10/05/2020, 2:36 PM

## 2020-10-08 ENCOUNTER — Ambulatory Visit: Payer: Self-pay

## 2020-10-08 NOTE — Lactation Note (Signed)
This note was copied from a baby's chart. Lactation Consultation Note  Patient Name: Kaitlin Ward Date: 10/08/2020 Reason for consult: Follow-up assessment;NICU baby;Primapara;1st time breastfeeding;Early term 37-38.6wks Age:34 wk.o.  Visited with mom of 10 weeks old ETI NICU female, she's a P1 with some risk factors for decreased supply. She's been taking Moringa, brewer's yeast and oatmeal to try to increase her supply; and she has got it up from 4 oz/day to now 6 oz./day; praised her for her efforts.  Mom chooses to pump and bottle feed only. Reviewed power pumping and other strategies that may help with her supply.  Plan of care  Encouraged mom to continue pumping consistently every 2-3 hours, at least 8 pumping sessions/24 hours She'll start power pumping in the AM/or PM  No other support person at this time. All questions and concerns answered, mom to call NICU LC PRN.  Maternal Data   Mother's milk supply has improved but it's still BNL.  Feeding Mother's Current Feeding Choice: Breast Milk and Formula  Lactation Tools Discussed/Used Tools: Pump;Flanges Flange Size: Other (comment) (19 maymom inserts) Breast pump type: Double-Electric Breast Pump Pump Education: Setup, frequency, and cleaning;Milk Storage Reason for Pumping: NICU infant, mother's choice Pumping frequency: 7-8 times/24 hours Pumped volume: 25 mL (25-30 ml)  Interventions Interventions: Breast feeding basics reviewed;DEBP;Education  Discharge Pump: DEBP;Personal  Consult Status Consult Status: Follow-up Date: 10/08/20 Follow-up type: In-patient    Kaitlin Ward 10/08/2020, 4:46 PM

## 2021-10-23 DIAGNOSIS — F439 Reaction to severe stress, unspecified: Secondary | ICD-10-CM | POA: Diagnosis not present

## 2021-11-06 DIAGNOSIS — F439 Reaction to severe stress, unspecified: Secondary | ICD-10-CM | POA: Diagnosis not present

## 2021-11-14 DIAGNOSIS — Z01419 Encounter for gynecological examination (general) (routine) without abnormal findings: Secondary | ICD-10-CM | POA: Diagnosis not present

## 2021-11-14 DIAGNOSIS — E7800 Pure hypercholesterolemia, unspecified: Secondary | ICD-10-CM | POA: Insufficient documentation

## 2021-11-14 DIAGNOSIS — E88819 Insulin resistance, unspecified: Secondary | ICD-10-CM | POA: Diagnosis not present

## 2021-11-14 DIAGNOSIS — Z6821 Body mass index (BMI) 21.0-21.9, adult: Secondary | ICD-10-CM | POA: Diagnosis not present

## 2021-11-14 DIAGNOSIS — Z1151 Encounter for screening for human papillomavirus (HPV): Secondary | ICD-10-CM | POA: Diagnosis not present

## 2021-11-14 DIAGNOSIS — Z124 Encounter for screening for malignant neoplasm of cervix: Secondary | ICD-10-CM | POA: Diagnosis not present

## 2021-11-14 LAB — HM PAP SMEAR

## 2021-12-03 DIAGNOSIS — F439 Reaction to severe stress, unspecified: Secondary | ICD-10-CM | POA: Diagnosis not present

## 2021-12-09 DIAGNOSIS — J40 Bronchitis, not specified as acute or chronic: Secondary | ICD-10-CM | POA: Diagnosis not present

## 2021-12-13 DIAGNOSIS — F439 Reaction to severe stress, unspecified: Secondary | ICD-10-CM | POA: Diagnosis not present

## 2021-12-25 DIAGNOSIS — F439 Reaction to severe stress, unspecified: Secondary | ICD-10-CM | POA: Diagnosis not present

## 2021-12-29 DIAGNOSIS — F4320 Adjustment disorder, unspecified: Secondary | ICD-10-CM | POA: Diagnosis not present

## 2022-01-05 DIAGNOSIS — F4320 Adjustment disorder, unspecified: Secondary | ICD-10-CM | POA: Diagnosis not present

## 2022-01-08 DIAGNOSIS — F4320 Adjustment disorder, unspecified: Secondary | ICD-10-CM | POA: Diagnosis not present

## 2022-01-08 DIAGNOSIS — F439 Reaction to severe stress, unspecified: Secondary | ICD-10-CM | POA: Diagnosis not present

## 2022-01-09 ENCOUNTER — Ambulatory Visit: Payer: BC Managed Care – PPO | Admitting: Family Medicine

## 2022-01-09 ENCOUNTER — Encounter: Payer: Self-pay | Admitting: Family Medicine

## 2022-01-09 VITALS — BP 116/82 | HR 70 | Temp 97.6°F | Ht 65.0 in | Wt 121.0 lb

## 2022-01-09 DIAGNOSIS — K831 Obstruction of bile duct: Secondary | ICD-10-CM | POA: Insufficient documentation

## 2022-01-09 DIAGNOSIS — F418 Other specified anxiety disorders: Secondary | ICD-10-CM | POA: Insufficient documentation

## 2022-01-09 DIAGNOSIS — E7800 Pure hypercholesterolemia, unspecified: Secondary | ICD-10-CM

## 2022-01-09 DIAGNOSIS — N841 Polyp of cervix uteri: Secondary | ICD-10-CM | POA: Insufficient documentation

## 2022-01-09 DIAGNOSIS — R7303 Prediabetes: Secondary | ICD-10-CM | POA: Diagnosis not present

## 2022-01-09 DIAGNOSIS — D6861 Antiphospholipid syndrome: Secondary | ICD-10-CM | POA: Insufficient documentation

## 2022-01-09 DIAGNOSIS — J4520 Mild intermittent asthma, uncomplicated: Secondary | ICD-10-CM

## 2022-01-09 DIAGNOSIS — Z7689 Persons encountering health services in other specified circumstances: Secondary | ICD-10-CM | POA: Diagnosis not present

## 2022-01-09 NOTE — Assessment & Plan Note (Signed)
Continue healthy, low fat diet and exercise. Recommend rechecking yearly due to family hx

## 2022-01-09 NOTE — Progress Notes (Signed)
New Patient Office Visit  Subjective    Patient ID: Kaitlin Ward, female    DOB: 1985/05/07  Age: 36 y.o. MRN: [id number]  CC:  Chief Complaint  Patient presents with   Establish Care    Would like to discuss glucose and A1c elevated as well as cholesterol and would like to discuss (labs done 7/12 and 10/11). Family hx of prediabetes.     HPI Kaitlin Ward presents to establish care No previous PCP  Dr. Elon Spanner is her OB/GYN She has a therapist   Recent labs done and she has results with her today.   Recent Hgb A1c in October was 5.7%.  Family hx of prediabetes, not diabetes.   Asthma - only an issue when she is sick. No longer has flares with exercise.   Has a 35 year old and a 66 month old.  She has hx of fertility issues and 3 miscarriages.   Stress and anxiety related to separation but does not feel depressed.   Hx of elevated LDL.    Denies fever, chills, dizziness, chest pain, palpitations, shortness of breath, abdominal pain, N/V/D, urinary symptoms.     Outpatient Encounter Medications as of 01/09/2022  Medication Sig   albuterol (VENTOLIN HFA) 108 (90 Base) MCG/ACT inhaler Inhale 2 puffs into the lungs every 6 (six) hours as needed for wheezing or shortness of breath.    [DISCONTINUED] acetaminophen (TYLENOL) 325 MG tablet Take 2 tablets (650 mg total) by mouth every 4 (four) hours as needed for mild pain (temperature > 101.5.).   [DISCONTINUED] Ascorbic Acid (VITAMIN C) 1000 MG tablet Take 1,000 mg by mouth daily.   [DISCONTINUED] Cholecalciferol (VITAMIN D) 50 MCG (2000 UT) tablet Take 2,000 Units by mouth daily.   [DISCONTINUED] docusate sodium (COLACE) 100 MG capsule Take 1 capsule (100 mg total) by mouth 2 (two) times daily.   [DISCONTINUED] ferrous sulfate 325 (65 FE) MG tablet Take 1 tablet (325 mg total) by mouth daily with breakfast.   [DISCONTINUED] Follitropin Alfa (GONAL-F RFF REDIJECT) 900 UNIT/1.5ML SOLN  (Patient not taking: Reported on  01/09/2022)   [DISCONTINUED] ibuprofen (ADVIL) 600 MG tablet Take 1 tablet (600 mg total) by mouth 4 (four) times daily.   [DISCONTINUED] Omega-3 Fatty Acids (SUPER OMEGA 3 EPA/DHA) 1000 MG CAPS Take 1,000 mg by mouth daily.   [DISCONTINUED] oxyCODONE (ROXICODONE) 5 MG immediate release tablet Take 1 tablet (5 mg total) by mouth every 4 (four) hours as needed for severe pain.   [DISCONTINUED] Prenatal Vit-Fe Fumarate-FA (PRENATAL MULTIVITAMIN) TABS tablet Take 1 tablet by mouth daily.    [DISCONTINUED] Vitamin E 180 MG (400 UNIT) CAPS Take 400 Units by mouth daily.   No facility-administered encounter medications on file as of 01/09/2022.    Past Medical History:  Diagnosis Date   Anxiety    APS (antiphospholipid syndrome) (HCC)    suspected   Asthma    exercise induce   Cholestasis during pregnancy    Depression    Endometriosis    GERD (gastroesophageal reflux disease)    with pregancy   HPV (human papilloma virus) anogenital infection    Vaginal Pap smear, abnormal     Past Surgical History:  Procedure Laterality Date   CESAREAN SECTION N/A 08/27/2016   Procedure: CESAREAN SECTION;  Surgeon: Noland Fordyce, MD;  Location: [facility] BIRTHING SUITES;  Service: Obstetrics;  Laterality: N/A;   CESAREAN SECTION N/A 09/20/2020   Procedure: REPEAT CESAREAN SECTION EDC: 10-08-20 ALLERG: NKDA  PREVIOUS X 1;  Surgeon: Candice Camp, MD;  Location: [facility] LD ORS;  Service: Obstetrics;  Laterality: N/A;   COLPOSCOPY     DILATION AND EVACUATION  02/2019   DILATION AND EVACUATION N/A 08/13/2019   Procedure: DILATATION AND EVACUATION;  Surgeon: Ranae Pila, MD;  Location: [facility] OR;  Service: Gynecology;  Laterality: N/A;  DR. Elon Spanner IS ON CALL   WISDOM TOOTH EXTRACTION      Family History  Problem Relation Age of Onset   Diabetes Father    Vascular Disease Father    Hypertension Maternal Grandmother    Leukemia Maternal Grandmother    Hypertension Paternal Grandmother    Rheum arthritis Paternal  Grandmother     Social History   Socioeconomic History   Marital status: Married    Spouse name: Not on file   Number of children: 1   Years of education: Not on file   Highest education level: Not on file  Occupational History   Not on file  Tobacco Use   Smoking status: Never   Smokeless tobacco: Never  Vaping Use   Vaping Use: Never used  Substance and Sexual Activity   Alcohol use: Not Currently   Drug use: Never   Sexual activity: Yes  Other Topics Concern   Not on file  Social History Narrative   Not on file   Social Determinants of Health   Financial Resource Strain: Not on file  Food Insecurity: Not on file  Transportation Needs: Not on file  Physical Activity: Not on file  Stress: Not on file  Social Connections: Not on file  Intimate Partner Violence: Not on file    ROS      Objective    BP 116/82 (BP Location: Left Arm, Patient Position: Sitting, Cuff Size: Large)   Pulse 70   Temp 97.6 F (36.4 C) (Temporal)   Ht 5\' 5"  (1.651 m)   Wt 121 lb (54.9 kg)   SpO2 100%   BMI 20.14 kg/m   Physical Exam Constitutional:      General: She is not in acute distress.    Appearance: She is not ill-appearing.  Cardiovascular:     Rate and Rhythm: Normal rate.  Pulmonary:     Effort: Pulmonary effort is normal.  Neurological:     General: No focal deficit present.     Mental Status: She is alert and oriented to person, place, and time.  Psychiatric:        Mood and Affect: Mood normal.        Behavior: Behavior normal.        Thought Content: Thought content normal.         Assessment & Plan:   Problem List Items Addressed This Visit       Respiratory   Mild intermittent asthma without complication    controlled        Other   Elevated LDL cholesterol level    Continue healthy, low fat diet and exercise. Recommend rechecking yearly due to family hx      Prediabetes - Primary    Hgb A1c 5.7% in October 2023. Continue low sugar,  low carbohydrate diet and getting plenty of exercise. Recommend annual A1c due to family hx.       Situational anxiety    Managing well. Continue seeing therapist.       Other Visit Diagnoses     Encounter to establish care           Return if symptoms  worsen or fail to improve.   Hetty Blend, NP-C

## 2022-01-09 NOTE — Assessment & Plan Note (Signed)
controlled 

## 2022-01-09 NOTE — Assessment & Plan Note (Signed)
Managing well. Continue seeing therapist.

## 2022-01-09 NOTE — Assessment & Plan Note (Signed)
Hgb A1c 5.7% in October 2023. Continue low sugar, low carbohydrate diet and getting plenty of exercise. Recommend annual A1c due to family hx.

## 2022-01-09 NOTE — Patient Instructions (Addendum)
It was a pleasure meeting you today.  Thank you for trusting [place] with your health care.  Continue taking good care of yourself with a healthy diet and exercise.  Follow-up as needed.    Preventing High Cholesterol Cholesterol is a white, waxy substance similar to fat that the human body needs to help build cells. The liver makes all the cholesterol that a person's body needs. Having high cholesterol (hypercholesterolemia) increases your risk for heart disease and stroke. Extra or excess cholesterol comes from the food that you eat. High cholesterol can often be prevented with diet and lifestyle changes. If you already have high cholesterol, you can control it with diet, lifestyle changes, and medicines. How can high cholesterol affect me? If you have high cholesterol, fatty deposits (plaques) may build up on the walls of your blood vessels. The blood vessels that carry blood away from your heart are called arteries. Plaques make the arteries narrower and stiffer. This in turn can: Restrict or block blood flow and cause blood clots to form. Increase your risk for heart attack and stroke. What can increase my risk for high cholesterol? This condition is more likely to develop in people who: Eat foods that are high in saturated fat or cholesterol. Saturated fat is mostly found in foods that come from animal sources. Are overweight. Are not getting enough exercise. Use products that contain nicotine or tobacco, such as cigarettes, e-cigarettes, and chewing tobacco. Have a family history of high cholesterol (familial hypercholesterolemia). What actions can I take to prevent this? Nutrition  Eat less saturated fat. Avoid trans fats (partially hydrogenated oils). These are often found in margarine and in some baked goods, fried foods, and snacks bought in packages. Avoid precooked or cured meat, such as bacon, sausages, or meat loaves. Avoid foods and drinks that have added sugars. Eat more  fruits, vegetables, and whole grains. Choose healthy sources of protein, such as fish, poultry, lean cuts of red meat, beans, peas, lentils, and nuts. Choose healthy sources of fat, such as: Nuts. Vegetable oils, especially olive oil. Fish that have healthy fats, such as omega-3 fatty acids. These fish include mackerel or salmon. Lifestyle Lose weight if you are overweight. Maintaining a healthy body mass index (BMI) can help prevent or control high cholesterol. It can also lower your risk for diabetes and high blood pressure. Ask your health care provider to help you with a diet and exercise plan to lose weight safely. Do not use any products that contain nicotine or tobacco. These products include cigarettes, chewing tobacco, and vaping devices, such as e-cigarettes. If you need help quitting, ask your health care provider. Alcohol use Do not drink alcohol if: Your health care provider tells you not to drink. You are pregnant, may be pregnant, or are planning to become pregnant. If you drink alcohol: Limit how much you have to: 0-1 drink a day for women. 0-2 drinks a day for men. Know how much alcohol is in your drink. In the [place] one drink equals one 12 oz bottle of beer (355 mL), one 5 oz glass of wine (148 mL), or one 1 oz glass of hard liquor (44 mL). Activity  Get enough exercise. Do exercises as told by your health care provider. Each week, do at least 150 minutes of exercise that takes a medium level of effort (moderate-intensity exercise). This kind of exercise: Makes your heart beat faster while allowing you to still be able to talk. Can be done in short sessions several  times a day or longer sessions a few times a week. For example, on 5 days each week, you could walk fast or ride your bike 3 times a day for 10 minutes each time. Medicines Your health care provider may recommend medicines to help lower cholesterol. This may be a medicine to lower the amount of cholesterol that  your liver makes. You may need medicine if: Diet and lifestyle changes have not lowered your cholesterol enough. You have high cholesterol and other risk factors for heart disease or stroke. Take over-the-counter and prescription medicines only as told by your health care provider. General information Manage your risk factors for high cholesterol. Talk with your health care provider about all your risk factors and how to lower your risk. Manage other conditions that you have, such as diabetes or high blood pressure (hypertension). Have blood tests to check your cholesterol levels at regular points in time as told by your health care provider. Keep all follow-up visits. This is important. Where to find more information American Heart Association: www.heart.org National Heart, Lung, and Blood Institute: [employer].is Summary High cholesterol increases your risk for heart disease and stroke. By keeping your cholesterol level low, you can reduce your risk for these conditions. High cholesterol can often be prevented with diet and lifestyle changes. Work with your health care provider to manage your risk factors, and have your blood tested regularly. This information is not intended to replace advice given to you by your health care provider. Make sure you discuss any questions you have with your health care provider. Document Revised: 08/24/2021 Document Reviewed: 03/27/2020 Elsevier Patient Education  2023 [employer].

## 2022-01-16 ENCOUNTER — Encounter: Payer: Self-pay | Admitting: Family Medicine

## 2022-01-22 DIAGNOSIS — F4320 Adjustment disorder, unspecified: Secondary | ICD-10-CM | POA: Diagnosis not present

## 2022-01-22 DIAGNOSIS — F439 Reaction to severe stress, unspecified: Secondary | ICD-10-CM | POA: Diagnosis not present

## 2022-07-31 IMAGING — US US OB < 14 WEEKS - US OB TV
1 series · 14 of 28 positions shown · non-contrast
Comparison: 02/16/2020

CLINICAL DATA: Habitual abortions. First trimester pregnancy with
inconclusive fetal viability.

EXAM:
OBSTETRIC <14 WK ULTRASOUND
TECHNIQUE: Transabdominal ultrasound was performed for evaluation of the
gestation as well as the maternal uterus and adnexal regions.

[Series 1: us ob less than 14 weeks with ob transvaginal · 14 of 150 slices shown]
[im 6/150]
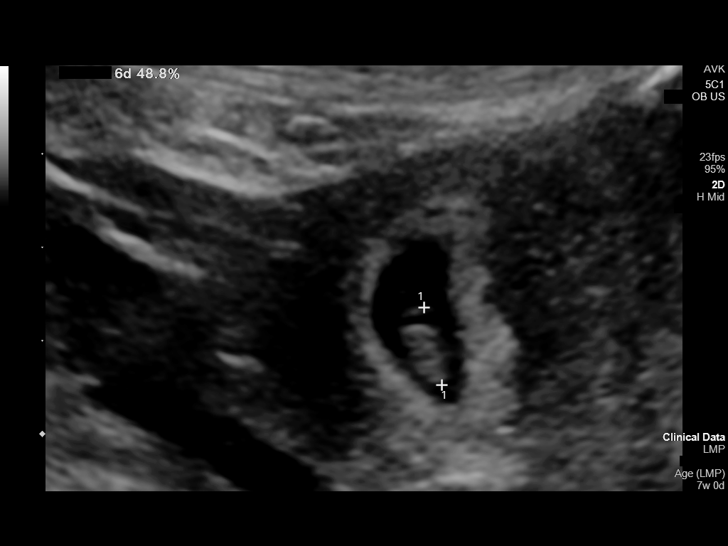
[im 17/150]
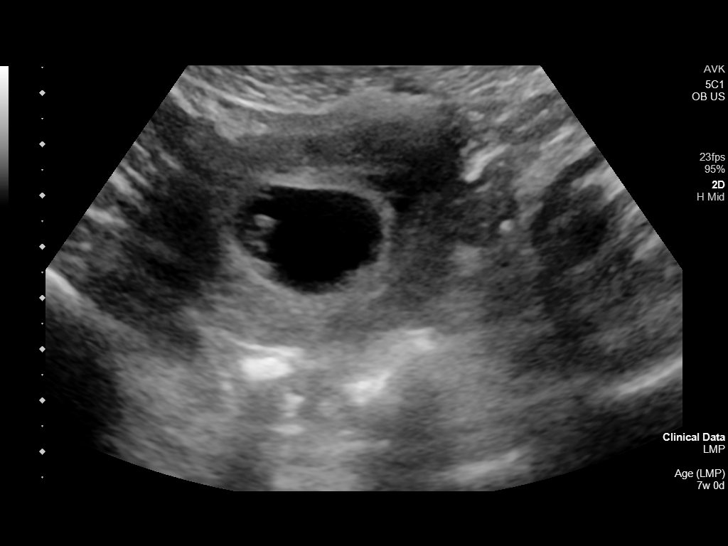
[im 28/150]
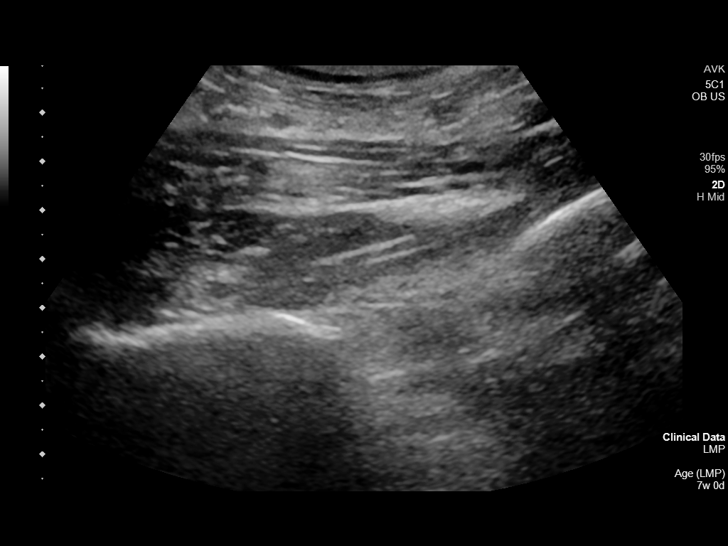
[im 39/150]
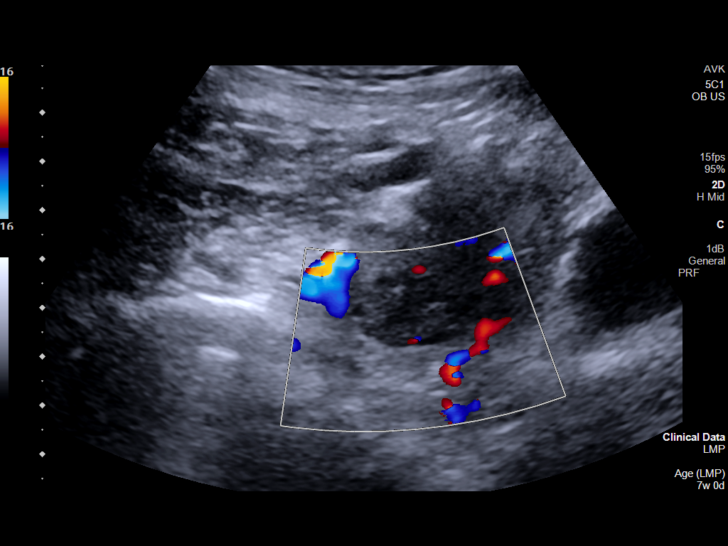
[im 50/150]
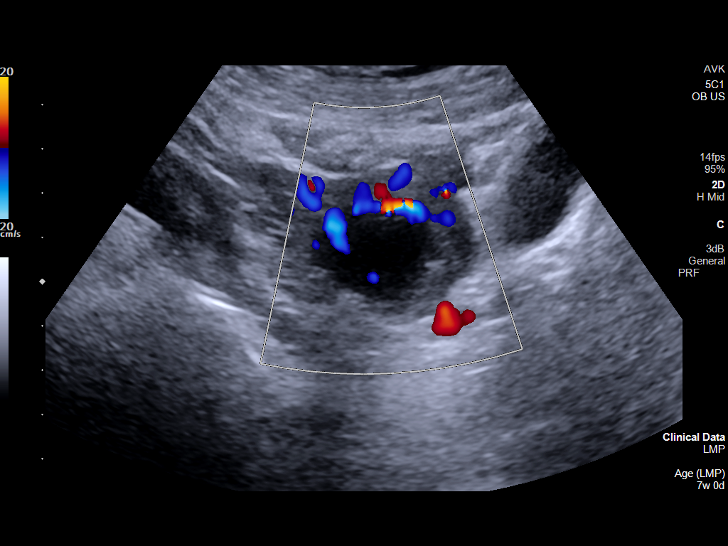
[im 61/150]
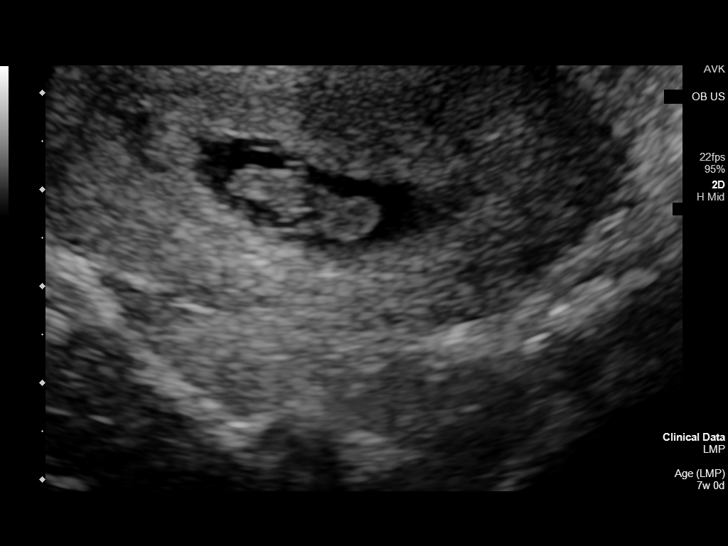
[im 72/150]
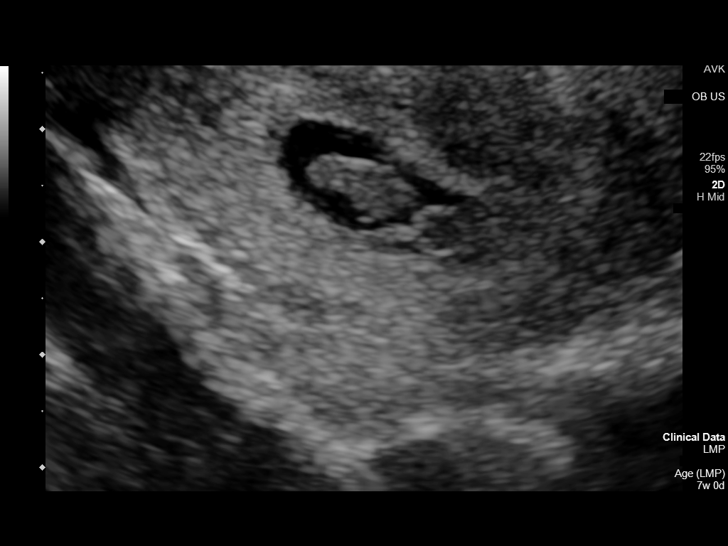
[im 83/150]
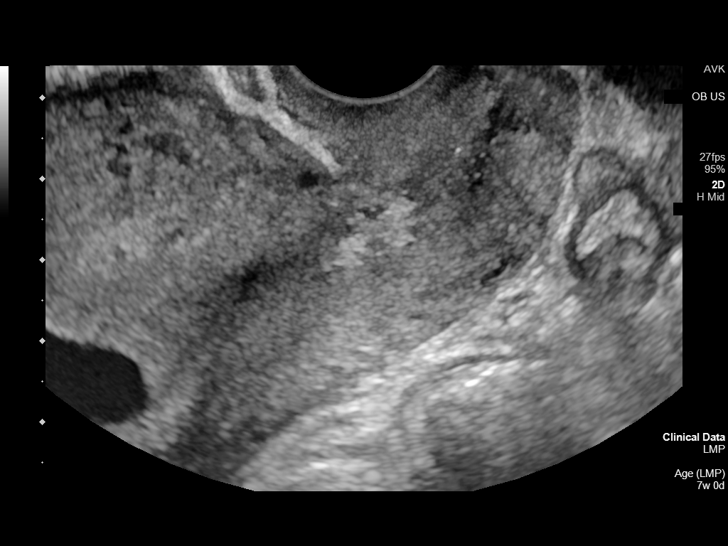
[im 94/150]
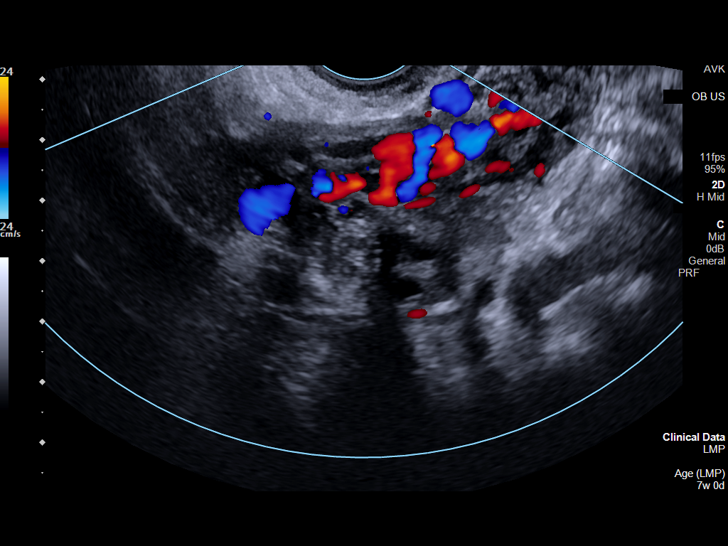
[im 105/150]
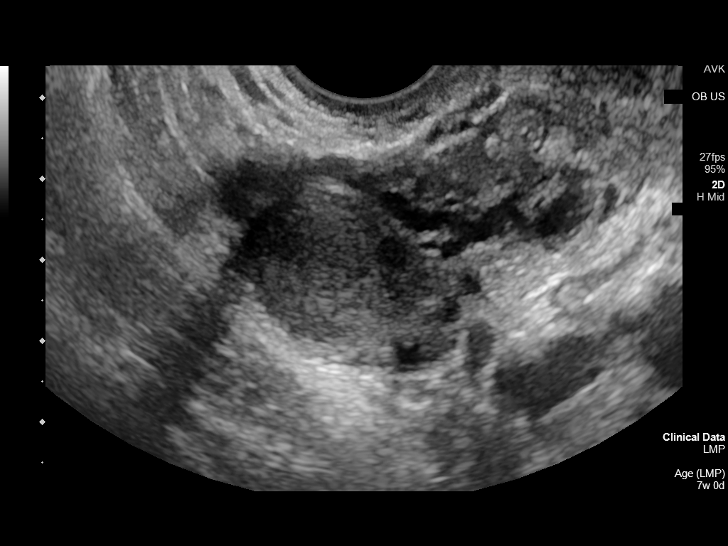
[im 116/150]
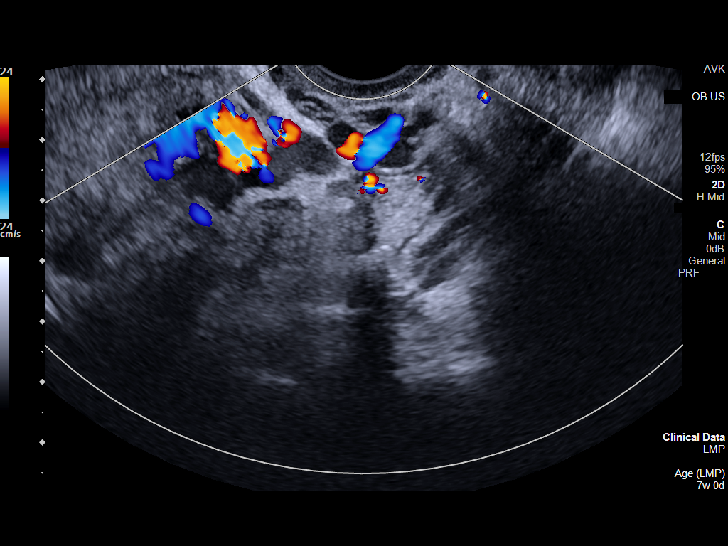
[im 127/150]
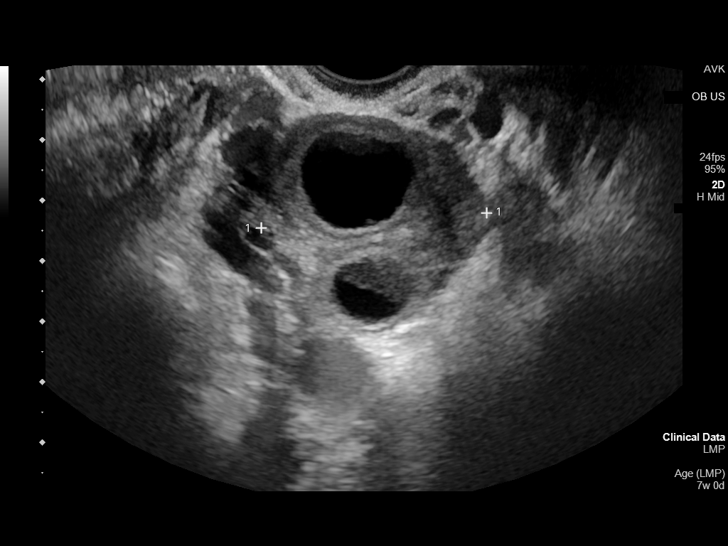
[im 138/150]
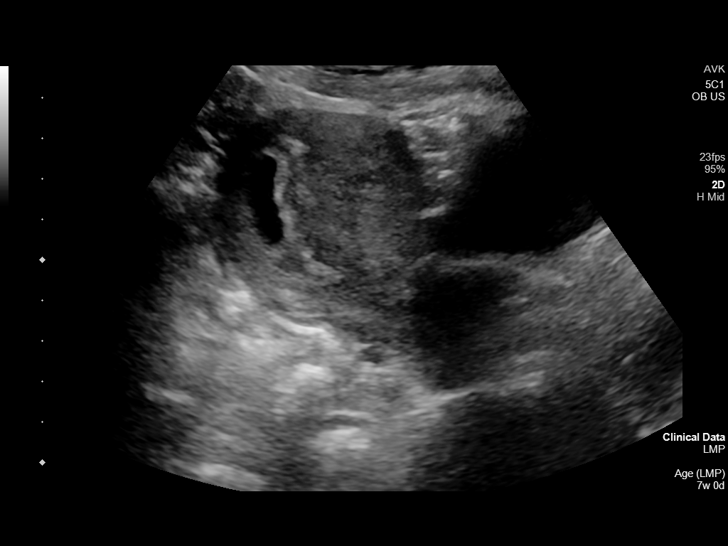
[im 150/150]
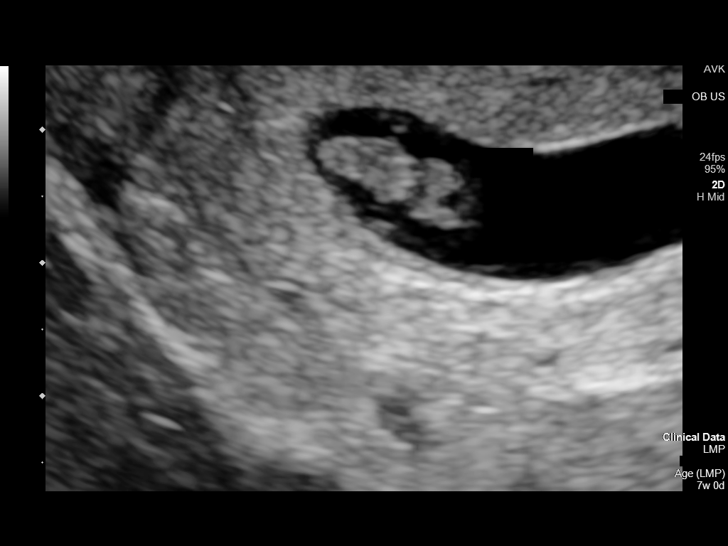

[14 of 28 positions shown; findings below may reference images not displayed]

FINDINGS: Intrauterine gestational sac: Single

Yolk sac:  Visualized.

Embryo:  Visualized.

Cardiac Activity: Visualized.

Heart Rate: 166 bpm

CRL:   11 mm   7 w 2 d                  US EDC: 10/10/2020

Subchorionic hemorrhage:  None visualized.

Maternal uterus/adnexae: Both ovaries are normal in appearance. No
masses or abnormal free fluid identified.
IMPRESSION: Single living IUP with assigned gestational age of 7 weeks 0 days.
Appropriate fetal growth.

No maternal uterine or adnexal abnormality identified.

## 2022-08-13 IMAGING — US US OB TRANSVAGINAL
1 series · 15 of 28 positions shown · non-contrast
Comparison: 02/24/2020.

CLINICAL DATA: Habitual abortions.  Fetal viability evaluation.

EXAM:
OBSTETRIC <14 WK US AND TRANSVAGINAL OB US
TECHNIQUE: Both transabdominal and transvaginal ultrasound examinations were
performed for complete evaluation of the gestation as well as the
maternal uterus, adnexal regions, and pelvic cul-de-sac.
Transvaginal technique was performed to assess early pregnancy.

[Series 1: us ob transvaginal · 39 acquisitions, 15 frames shown]
[im 1/39]
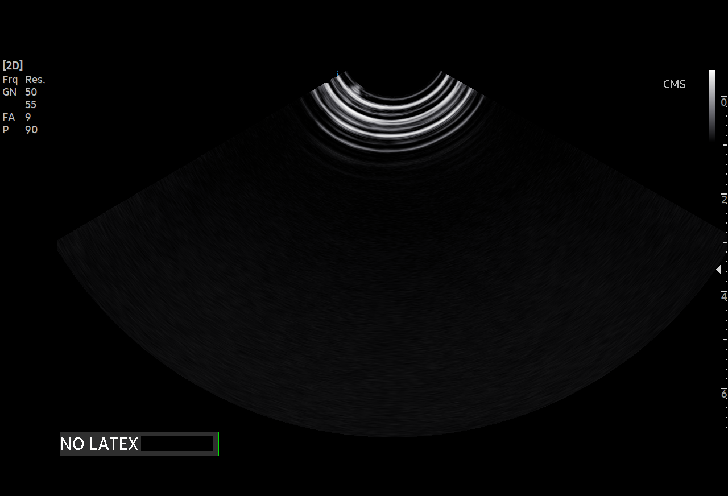
[im 3/39]
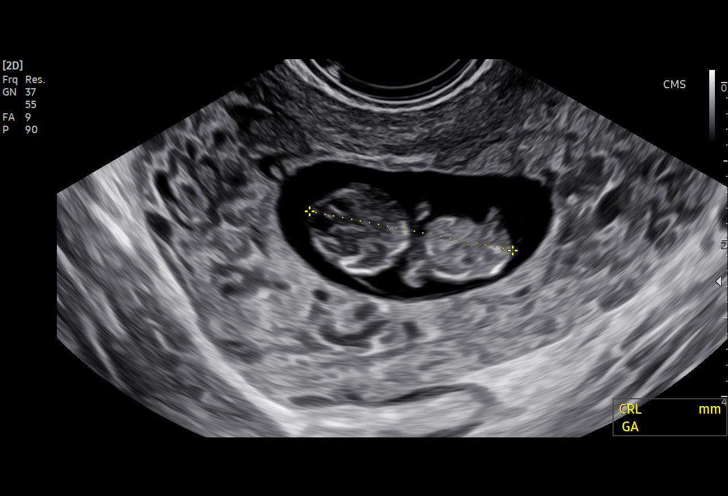
[im 6/39]
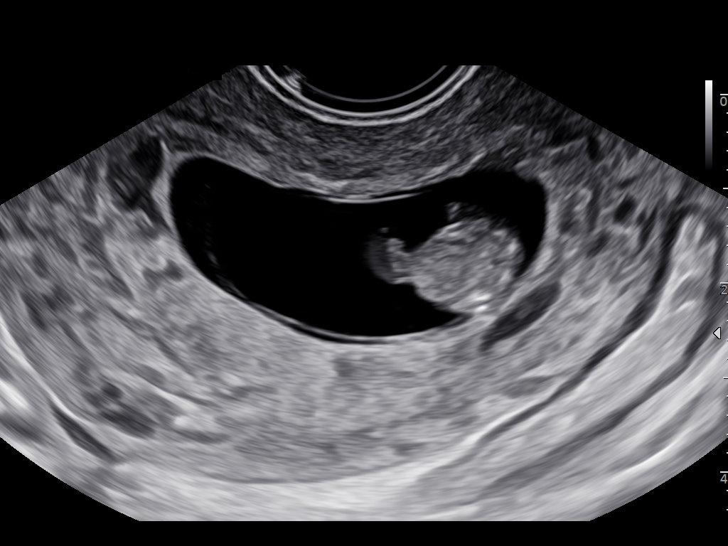
[im 9/39]
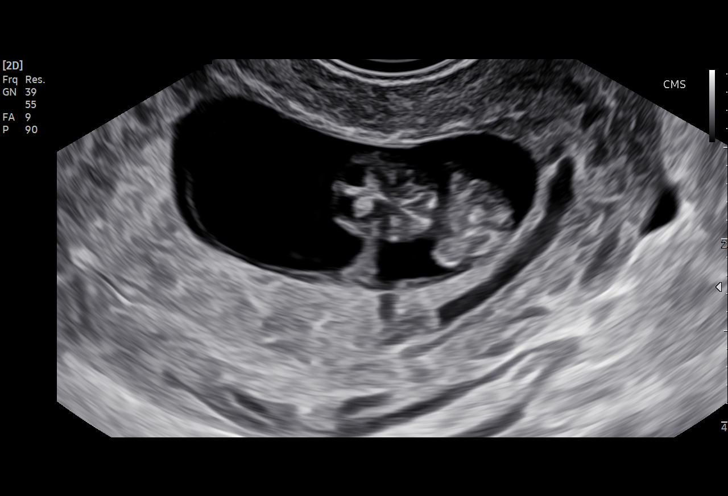
[im 12/39]
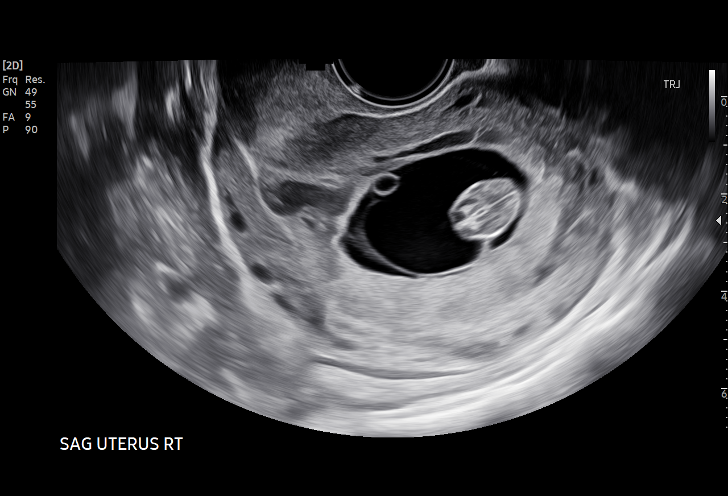
[im 15/39]
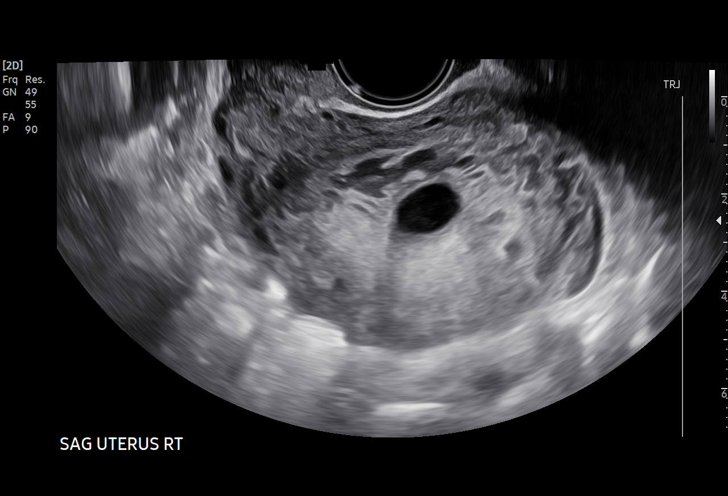
[im 17/39]
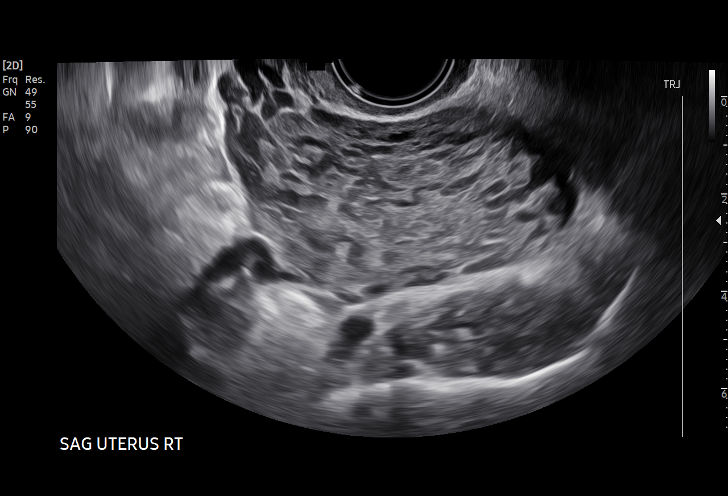
[im 20/39]
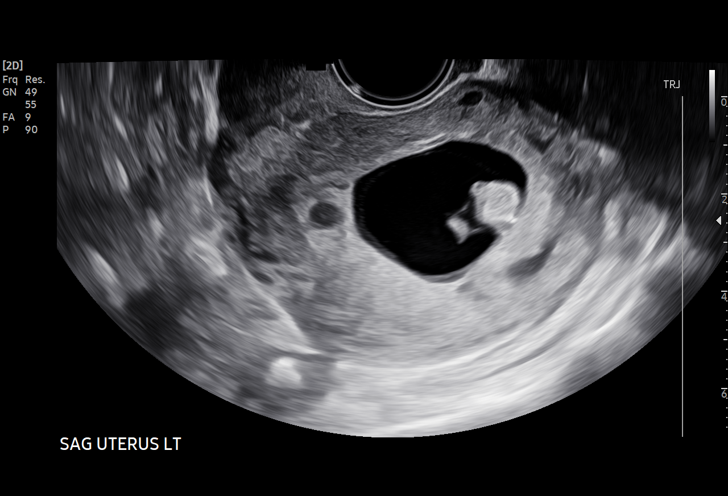
[im 22/39]
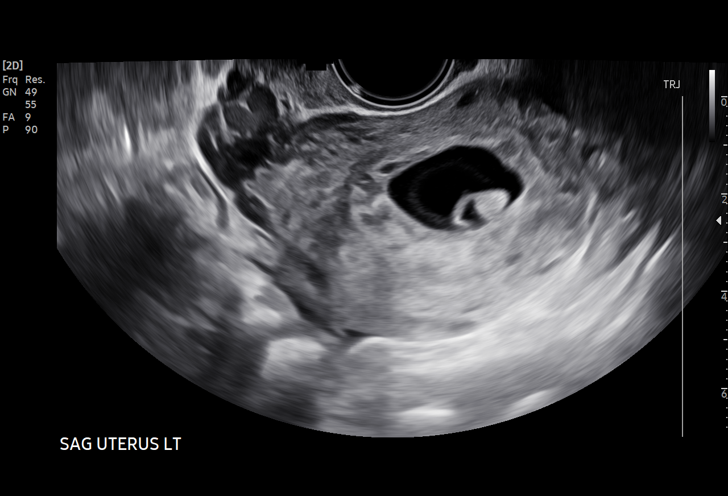
[im 24/39]
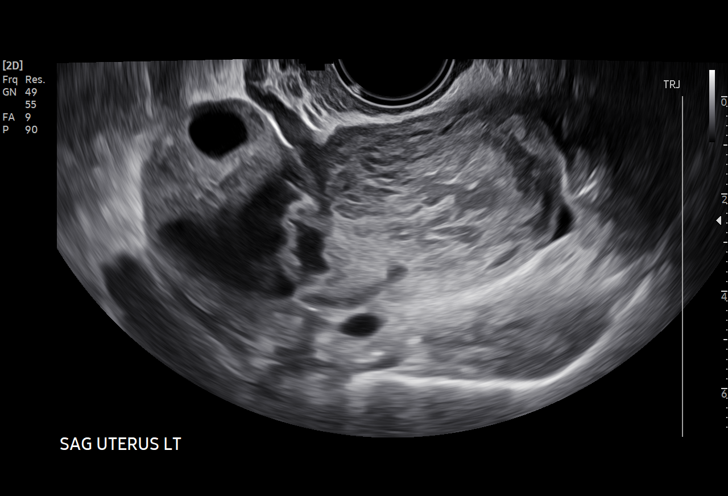
[im 27/39]
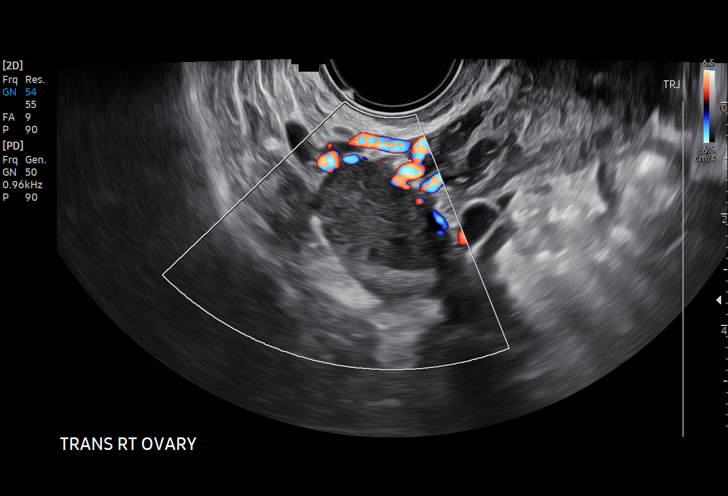
[im 30/39]
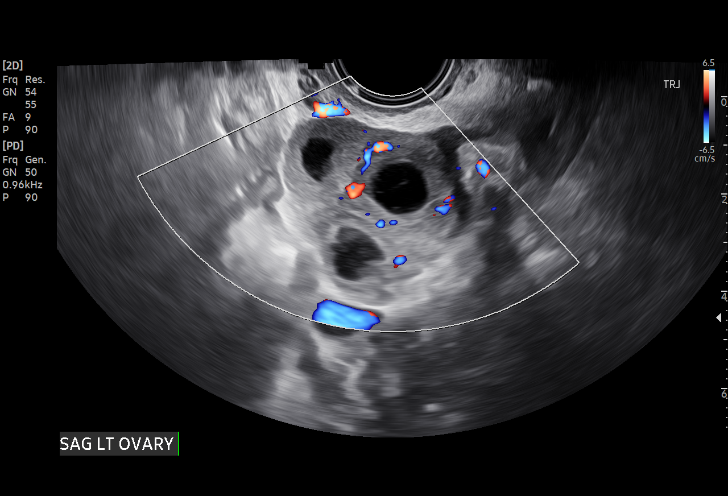
[im 33/39]
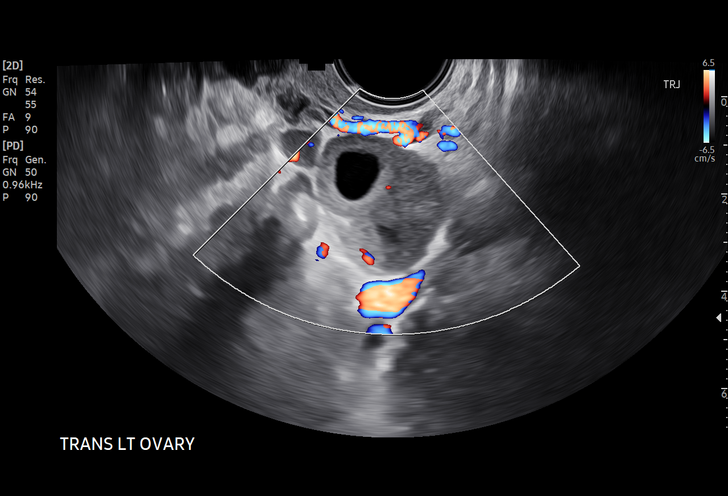
[im 36/39]
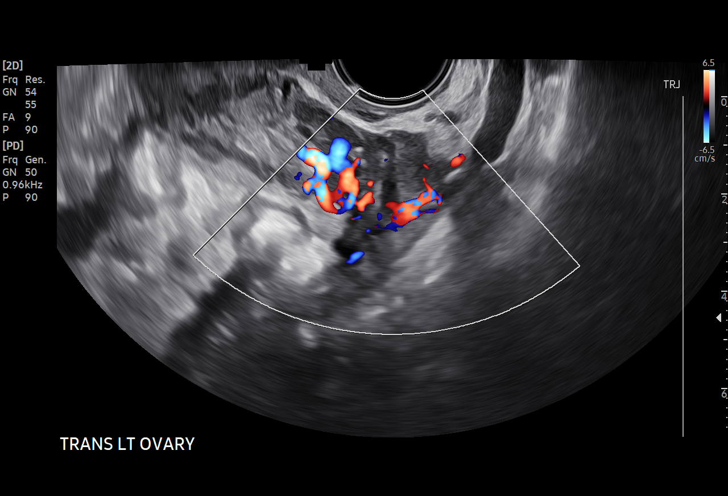
[im 39/39]
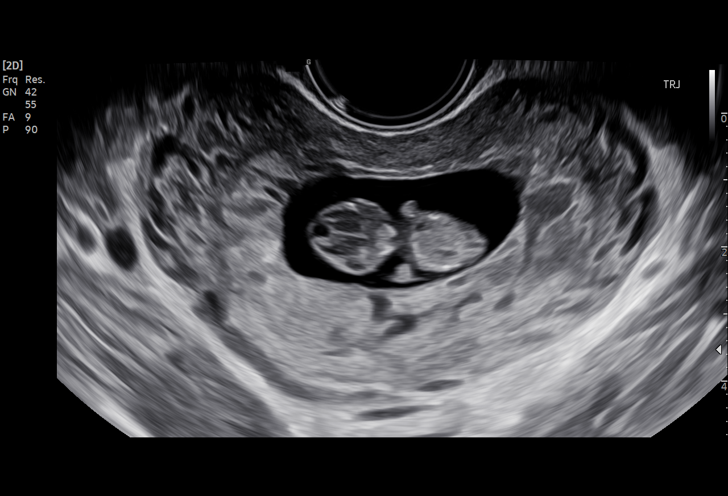

[15 of 28 positions shown; findings below may reference images not displayed]

FINDINGS: Intrauterine gestational sac: Single

Yolk sac:  Present

Embryo:  Present

Cardiac Activity: Present

Heart Rate: 174 bpm

CRL: 26.3 mm   9 w   3 d                  US EDC: 10/08/2020

Subchorionic hemorrhage:  None

Maternal uterus/adnexae: Vascular Zeena noted at internal cervical
os. Ovarian follicles again noted. No free fluid.
IMPRESSION: Single viable intrauterine pregnancy at 9 weeks 3 days.

## 2022-08-20 IMAGING — US US OB COMP LESS 14 WK
1 series · 14 of 28 positions shown · non-contrast
Comparison: Ultrasound 03/09/2020

CLINICAL DATA: Viability

EXAM:
OBSTETRIC <14 WK ULTRASOUND
TECHNIQUE: Transabdominal ultrasound was performed for evaluation of the
gestation as well as the maternal uterus and adnexal regions.

[Series 1: us ob comp less 14 wk · 0.20mm/px · 14 of 65 slices shown]
[im 3/65]
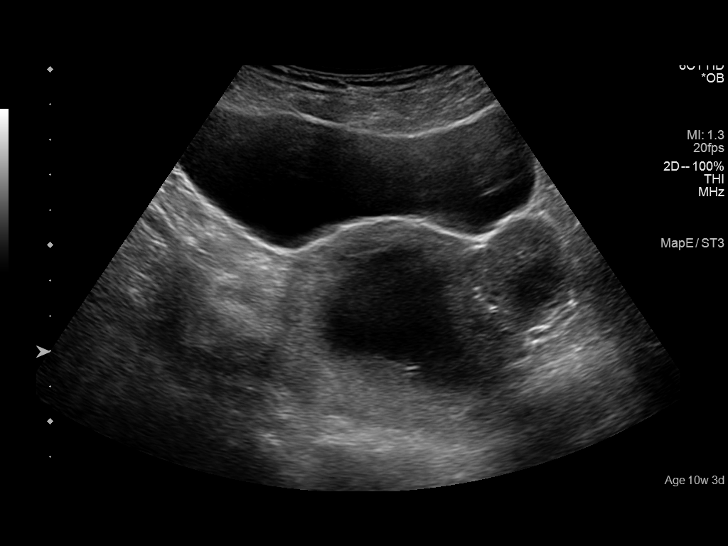
[im 8/65]
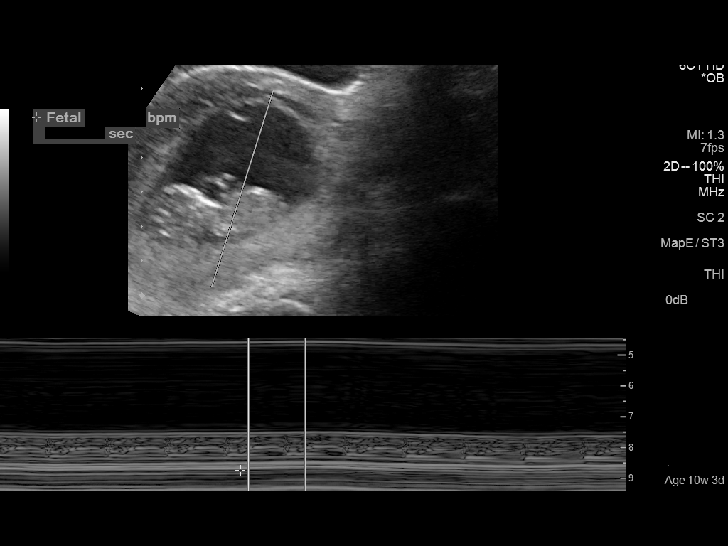
[im 12/65]
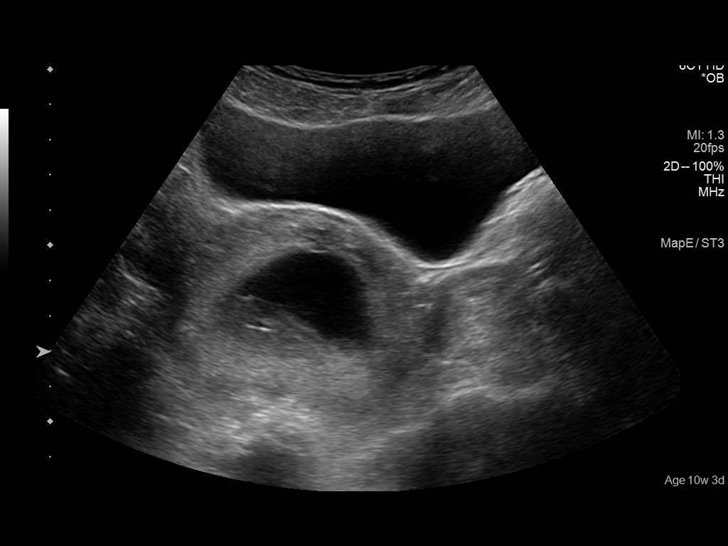
[im 17/65]
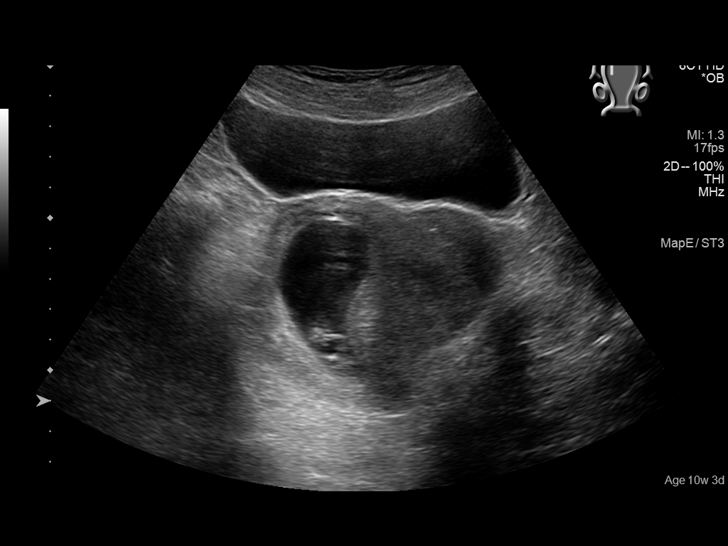
[im 22/65]
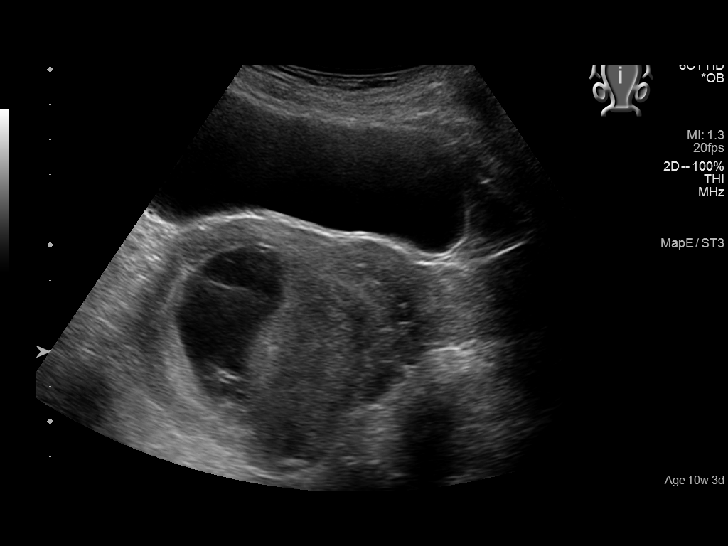
[im 27/65]
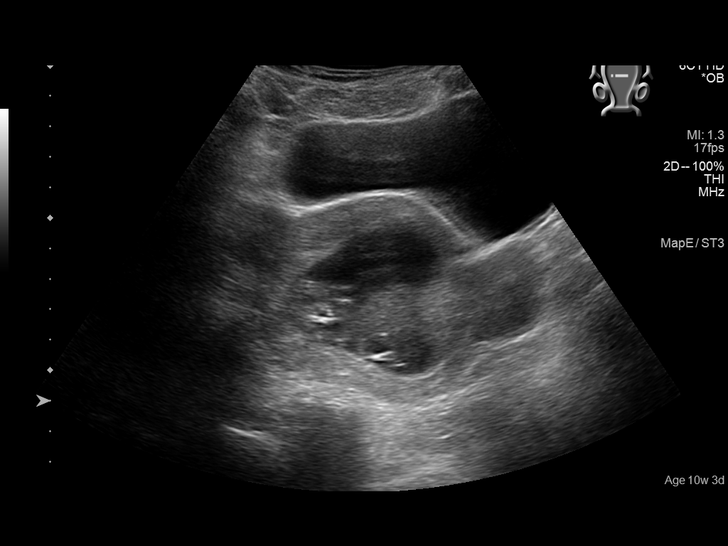
[im 31/65]
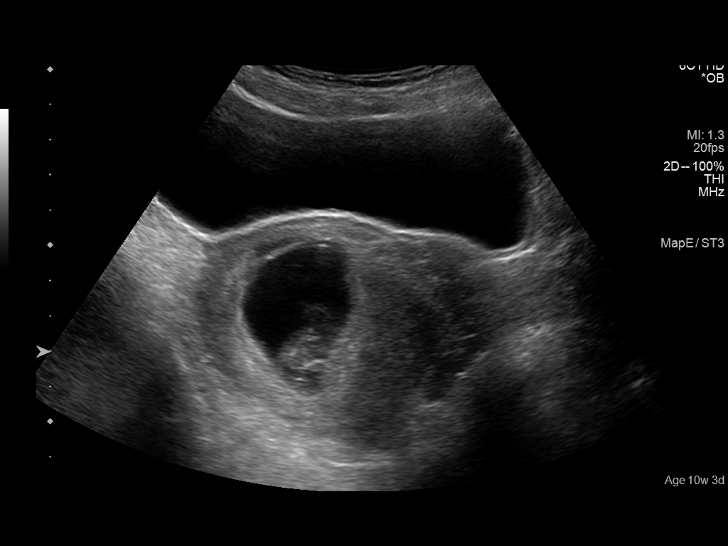
[im 36/65]
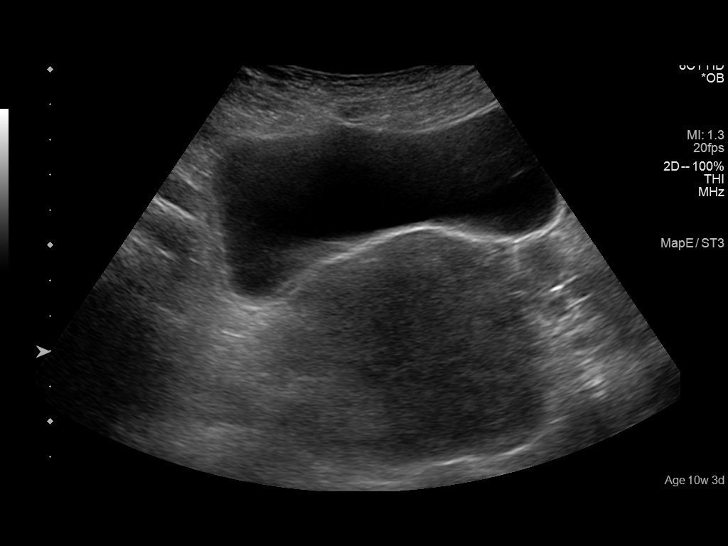
[im 41/65]
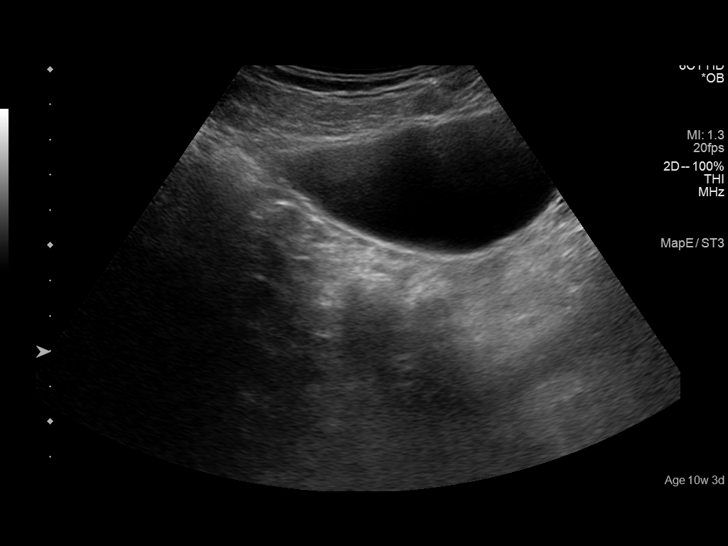
[im 46/65]
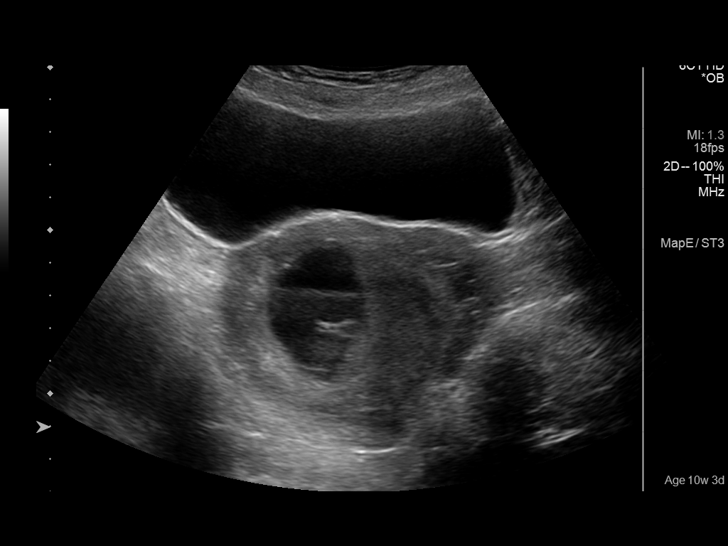
[im 50/65]
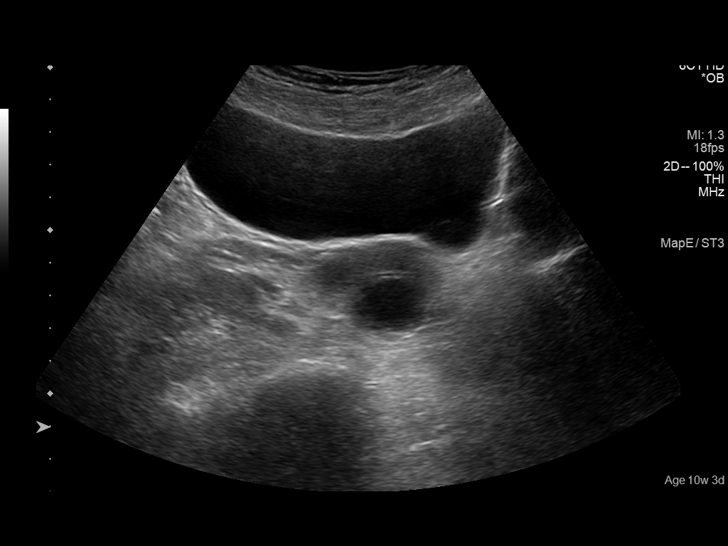
[im 55/65]
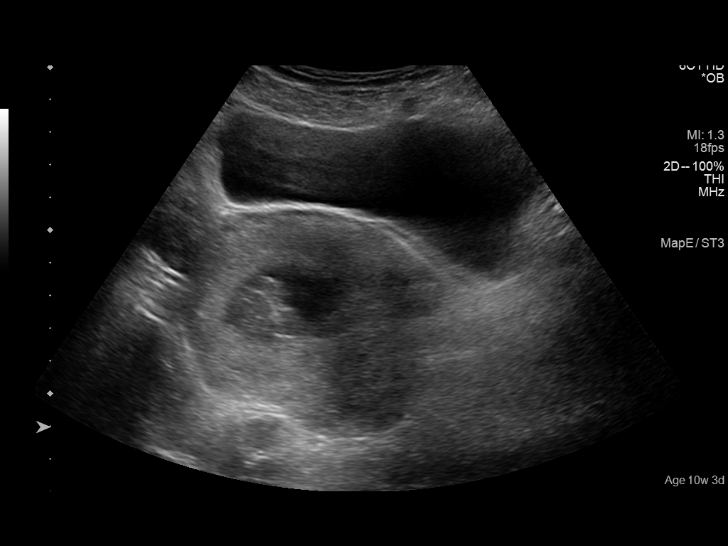
[im 60/65]
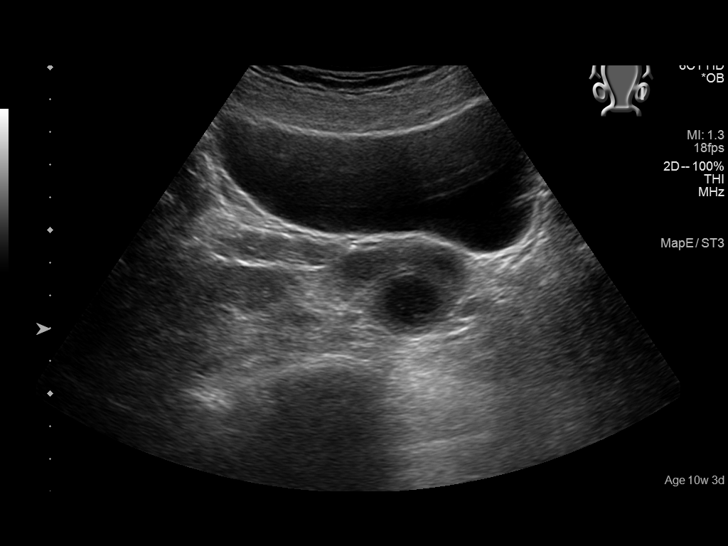
[im 65/65]
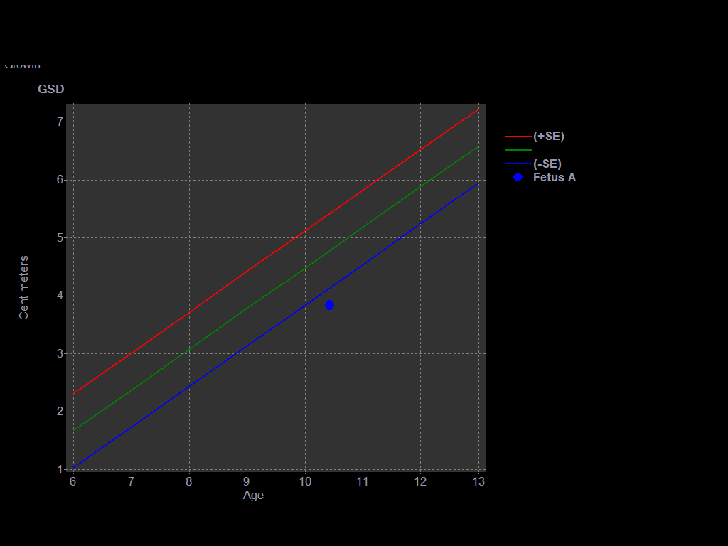

[14 of 28 positions shown; findings below may reference images not displayed]

FINDINGS: Intrauterine gestational sac: Single

Yolk sac:  Not Visualized.

Embryo:  Visualized.

Cardiac Activity: Visualized.

Heart Rate: 180 bpm

CRL:   41 mm   11 w 0 d                  US EDC: 10/04/2020

Subchorionic hemorrhage:  None visualized.

Maternal uterus/adnexae: The anteverted maternal uterus is otherwise
unremarkable. Normal appearance of the ovaries. Right ovary measures
2.7 x 1.5 x 0.8 cm. Left ovary measures 3.9 x 2.8 x 2.7 cm. No
pelvic free fluid.
IMPRESSION: Single viable intrauterine gestation at 11 weeks, 0 days by
crown-rump length sonographic estimation.

## 2022-08-28 IMAGING — US US OB COMP LESS 14 WK
1 series · 14 of 28 positions shown · non-contrast
Comparison: 03/15/2020

CLINICAL DATA: First trimester pregnancy, follow-up, infertility
treatments; assigned EGA 11 weeks 4 days

EXAM:
OBSTETRIC <14 WK ULTRASOUND
TECHNIQUE: Transabdominal ultrasound was performed for evaluation of the
gestation as well as the maternal uterus and adnexal regions.

[Series 1: us ob less than 14 weeks with ob transvaginal · 14 of 57 slices shown]
[im 3/57]
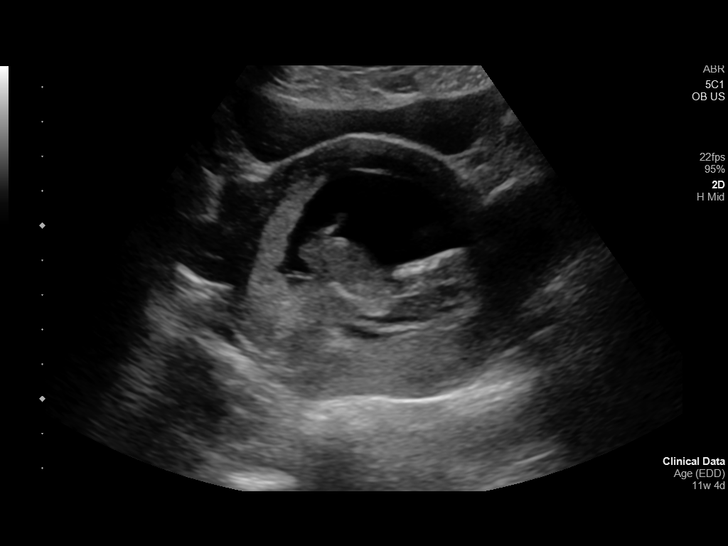
[im 7/57]
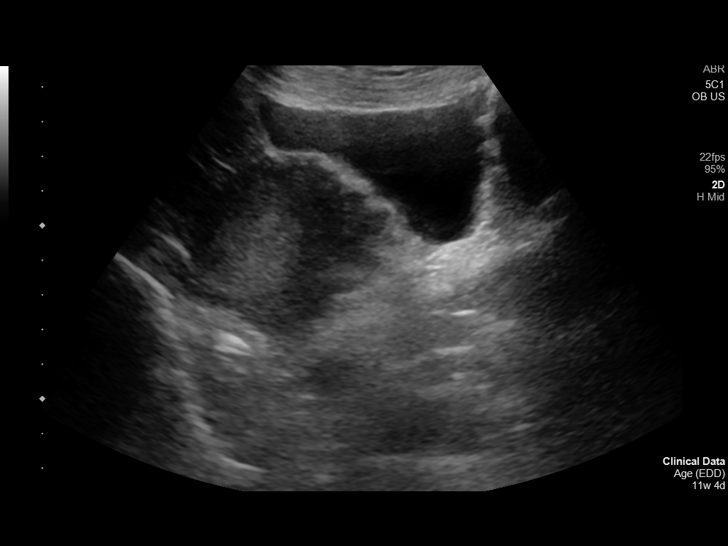
[im 11/57]
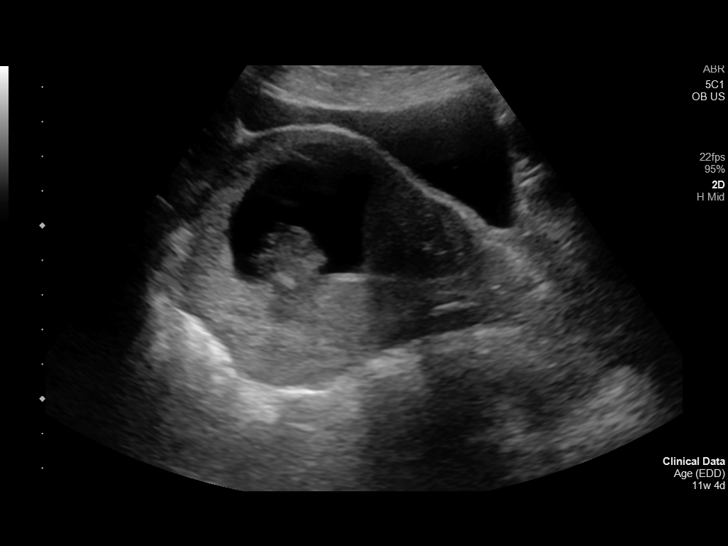
[im 15/57]
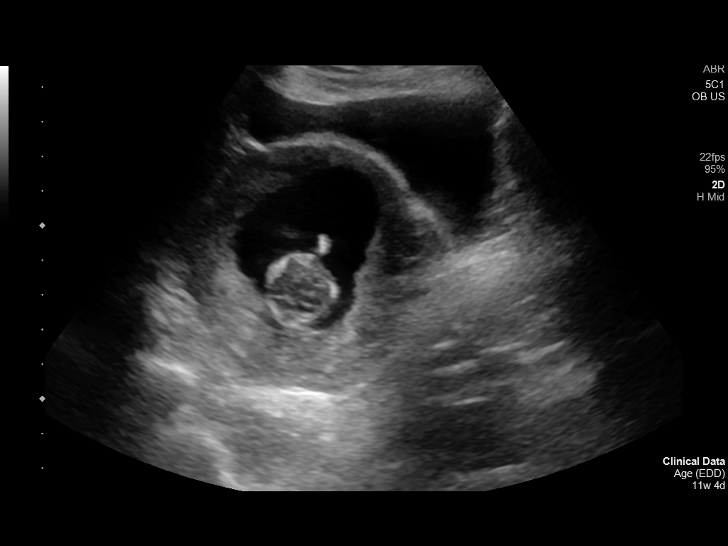
[im 19/57]
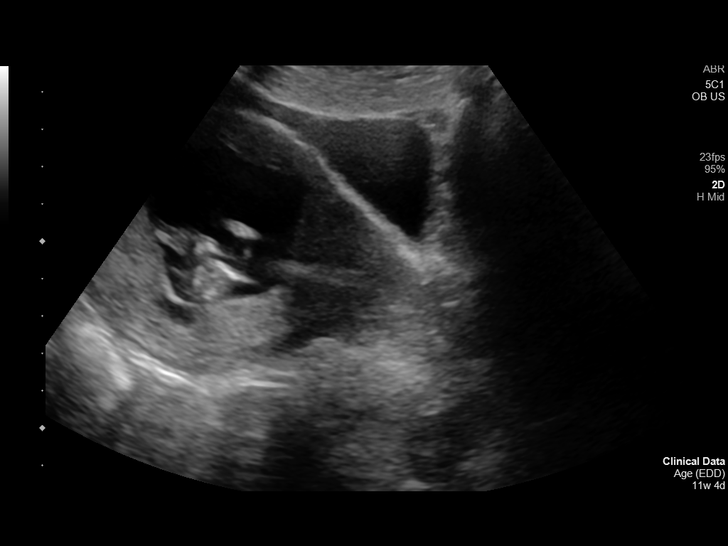
[im 23/57]
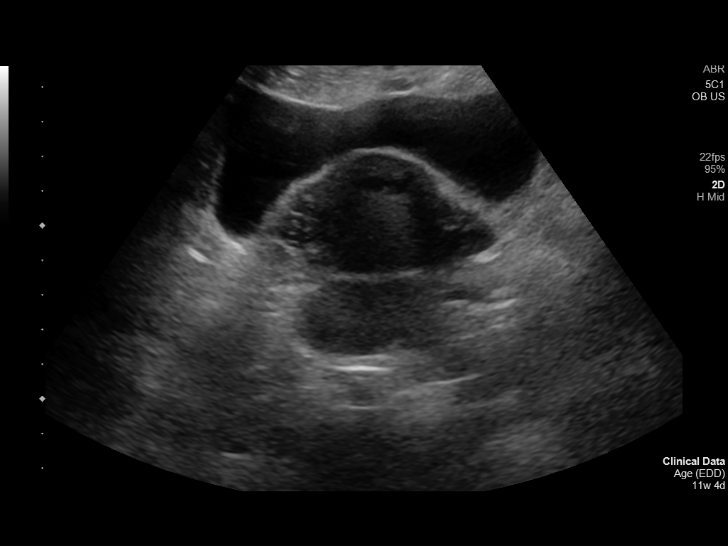
[im 27/57]
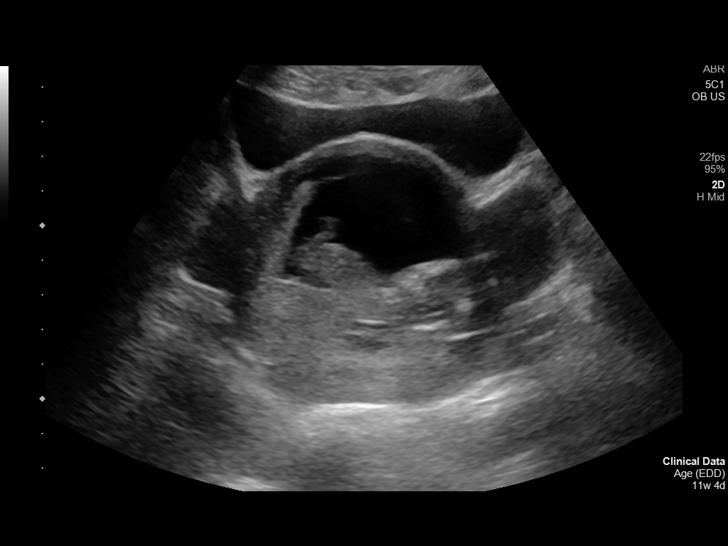
[im 32/57]
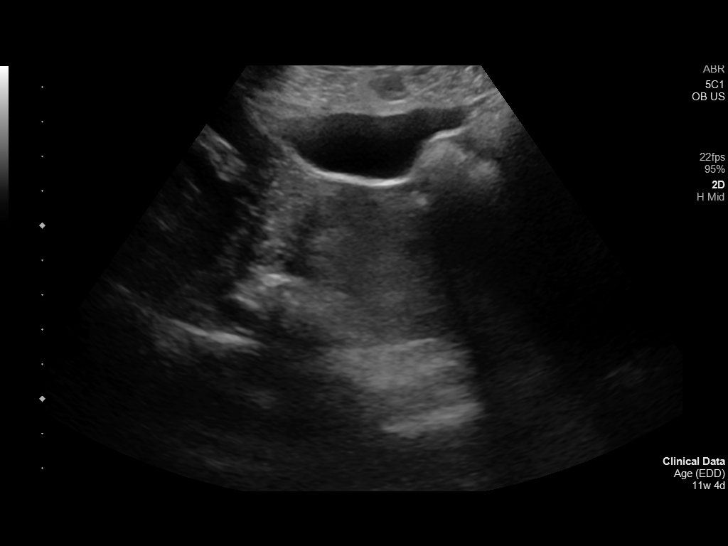
[im 36/57]
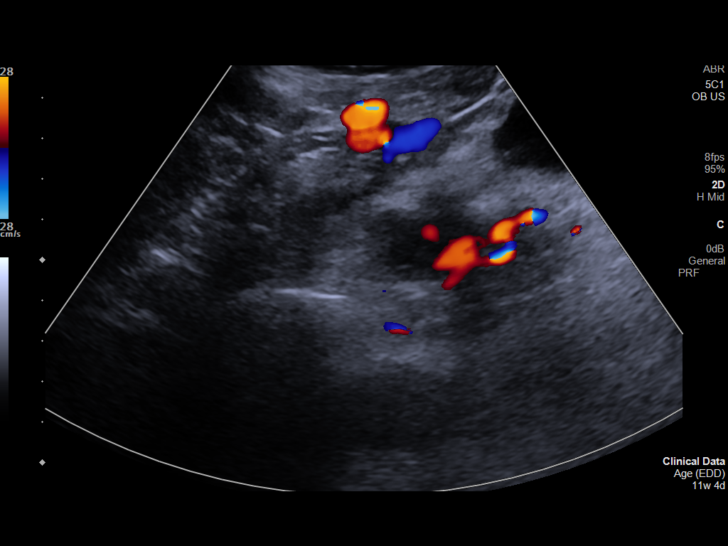
[im 40/57]
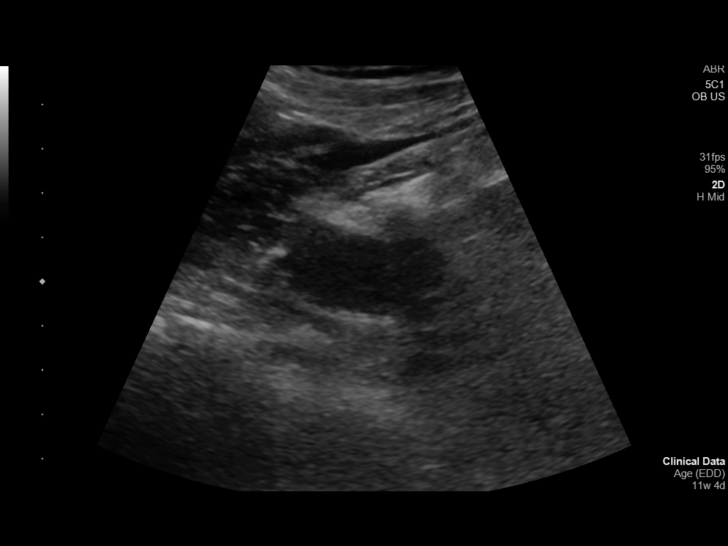
[im 44/57]
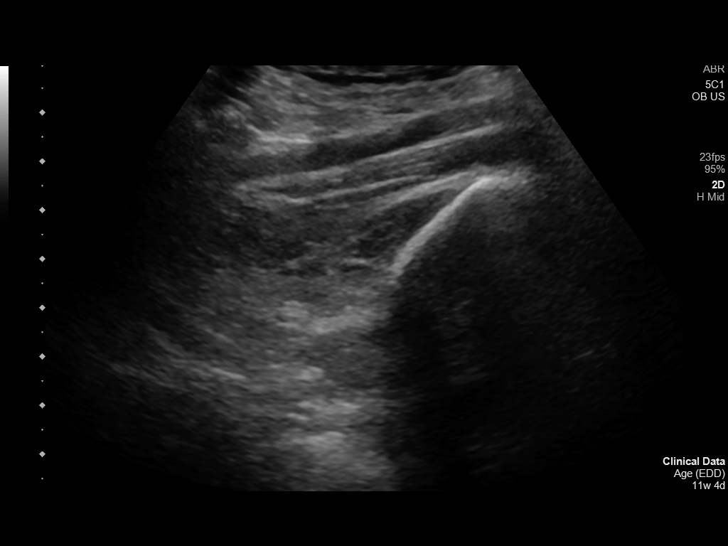
[im 48/57]
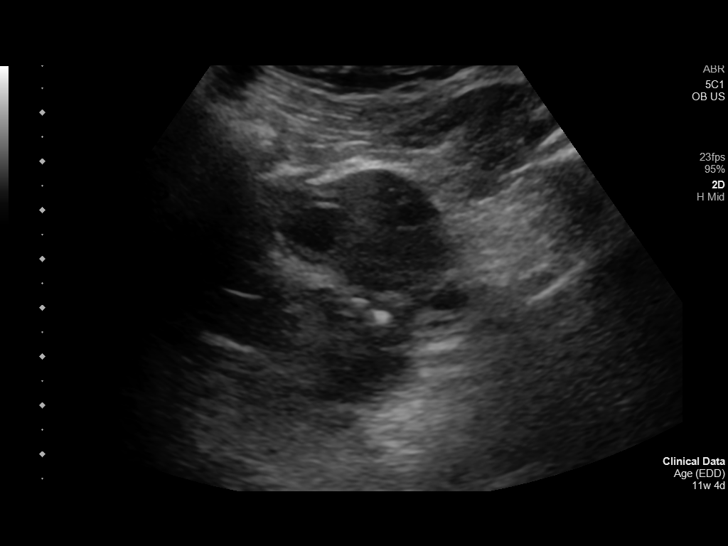
[im 52/57]
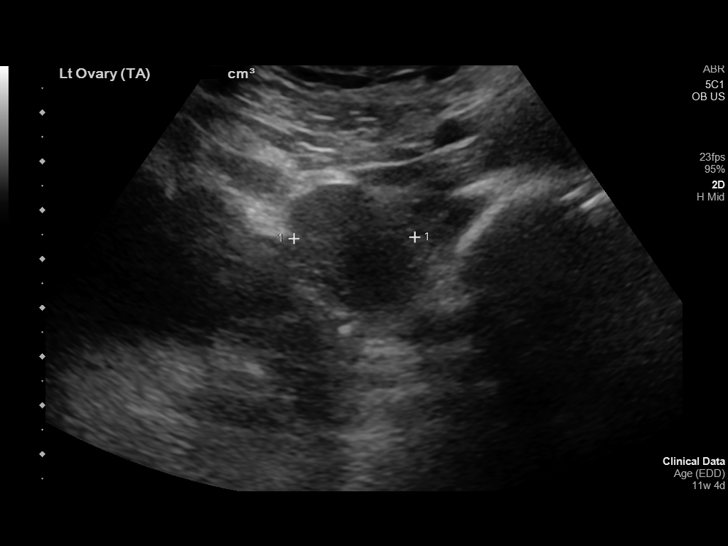
[im 57/57]
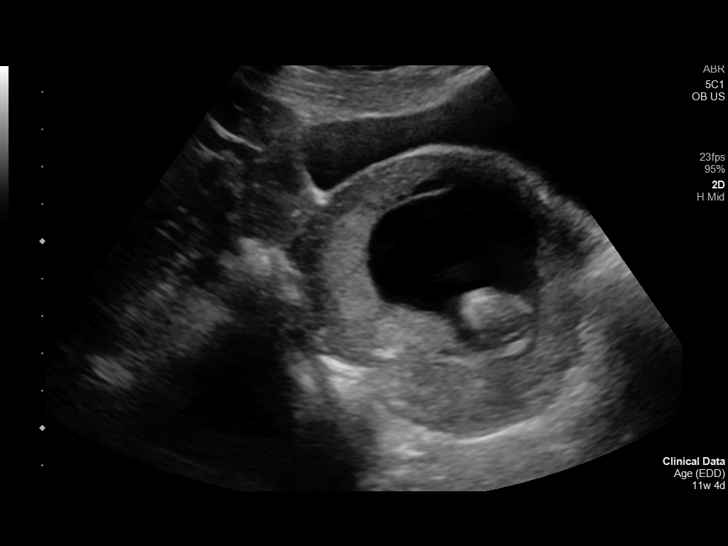

[14 of 28 positions shown; findings below may reference images not displayed]

FINDINGS: Intrauterine gestational sac: Present, single

Yolk sac:  Not identified

Embryo:  Present

Cardiac Activity: Present

Heart Rate: 164 bpm

CRL:   54.9 mm   12 w 1 d                  US EDC: 10/04/2020

Subchorionic hemorrhage:  None visualized.

Maternal uterus/adnexae:

RIGHT ovary normal size and morphology, 3.7 x 1.9 x 1.9 cm.

LEFT ovary normal size and morphology, 3.5 x 2.4 x 2.5 cm.

No free pelvic fluid or adnexal masses.
IMPRESSION: Single live intrauterine gestation as above.
